# Patient Record
Sex: Male | Born: 1940 | Race: White | Hispanic: No | Marital: Married | Smoking: Never smoker
Health system: Southern US, Community
[De-identification: ages and names within clinical notes are randomized; demographics above are authoritative.]

## PROBLEM LIST (undated history)

## (undated) DIAGNOSIS — I499 Cardiac arrhythmia, unspecified: Secondary | ICD-10-CM

## (undated) DIAGNOSIS — E785 Hyperlipidemia, unspecified: Secondary | ICD-10-CM

## (undated) DIAGNOSIS — M5126 Other intervertebral disc displacement, lumbar region: Secondary | ICD-10-CM

## (undated) DIAGNOSIS — N3010 Interstitial cystitis (chronic) without hematuria: Secondary | ICD-10-CM

## (undated) DIAGNOSIS — M7550 Bursitis of unspecified shoulder: Secondary | ICD-10-CM

## (undated) DIAGNOSIS — Z8719 Personal history of other diseases of the digestive system: Secondary | ICD-10-CM

## (undated) DIAGNOSIS — Z87442 Personal history of urinary calculi: Secondary | ICD-10-CM

## (undated) DIAGNOSIS — K649 Unspecified hemorrhoids: Secondary | ICD-10-CM

## (undated) DIAGNOSIS — N419 Inflammatory disease of prostate, unspecified: Secondary | ICD-10-CM

## (undated) DIAGNOSIS — I10 Essential (primary) hypertension: Secondary | ICD-10-CM

## (undated) DIAGNOSIS — M51369 Other intervertebral disc degeneration, lumbar region without mention of lumbar back pain or lower extremity pain: Secondary | ICD-10-CM

## (undated) DIAGNOSIS — N189 Chronic kidney disease, unspecified: Secondary | ICD-10-CM

## (undated) DIAGNOSIS — N400 Enlarged prostate without lower urinary tract symptoms: Secondary | ICD-10-CM

## (undated) DIAGNOSIS — C801 Malignant (primary) neoplasm, unspecified: Secondary | ICD-10-CM

## (undated) DIAGNOSIS — R42 Dizziness and giddiness: Secondary | ICD-10-CM

## (undated) DIAGNOSIS — Z860100 Personal history of colon polyps, unspecified: Secondary | ICD-10-CM

## (undated) DIAGNOSIS — M5136 Other intervertebral disc degeneration, lumbar region: Secondary | ICD-10-CM

## (undated) DIAGNOSIS — M1990 Unspecified osteoarthritis, unspecified site: Secondary | ICD-10-CM

## (undated) DIAGNOSIS — K219 Gastro-esophageal reflux disease without esophagitis: Secondary | ICD-10-CM

## (undated) HISTORY — DX: Personal history of colon polyps, unspecified: Z86.0100

## (undated) HISTORY — PX: HEMORRHOID BANDING: SHX5850

## (undated) HISTORY — PX: OTHER SURGICAL HISTORY: SHX169

## (undated) HISTORY — PX: PROSTATE SURGERY: SHX751

## (undated) HISTORY — DX: Personal history of colonic polyps: Z86.010

## (undated) HISTORY — DX: Interstitial cystitis (chronic) without hematuria: N30.10

## (undated) HISTORY — PX: LITHOTRIPSY: SUR834

## (undated) HISTORY — PX: HERNIA REPAIR: SHX51

## (undated) HISTORY — DX: Gastro-esophageal reflux disease without esophagitis: K21.9

## (undated) HISTORY — DX: Hyperlipidemia, unspecified: E78.5

## (undated) HISTORY — DX: Benign prostatic hyperplasia without lower urinary tract symptoms: N40.0

---

## 1996-08-18 HISTORY — PX: TRANSURETHRAL RESECTION OF PROSTATE: SHX73

## 2006-07-31 ENCOUNTER — Ambulatory Visit: Payer: Self-pay | Admitting: Internal Medicine

## 2006-08-25 ENCOUNTER — Ambulatory Visit: Payer: Self-pay | Admitting: Otolaryngology

## 2007-02-17 ENCOUNTER — Ambulatory Visit: Payer: Self-pay | Admitting: Unknown Physician Specialty

## 2007-12-30 ENCOUNTER — Emergency Department: Payer: Self-pay | Admitting: Unknown Physician Specialty

## 2008-08-28 ENCOUNTER — Ambulatory Visit: Payer: Self-pay | Admitting: Unknown Physician Specialty

## 2009-01-09 ENCOUNTER — Ambulatory Visit: Payer: Self-pay | Admitting: Urology

## 2009-01-22 ENCOUNTER — Ambulatory Visit: Payer: Self-pay | Admitting: Urology

## 2011-01-02 ENCOUNTER — Ambulatory Visit: Payer: Self-pay | Admitting: Internal Medicine

## 2012-01-26 ENCOUNTER — Ambulatory Visit: Payer: Self-pay | Admitting: Urology

## 2012-02-06 ENCOUNTER — Ambulatory Visit: Payer: Self-pay | Admitting: Unknown Physician Specialty

## 2012-06-05 DIAGNOSIS — I4510 Unspecified right bundle-branch block: Secondary | ICD-10-CM

## 2012-06-05 HISTORY — DX: Unspecified right bundle-branch block: I45.10

## 2012-06-28 ENCOUNTER — Ambulatory Visit: Payer: Self-pay | Admitting: Cardiovascular Disease

## 2012-07-06 ENCOUNTER — Ambulatory Visit (INDEPENDENT_AMBULATORY_CARE_PROVIDER_SITE_OTHER): Payer: Medicare Other | Admitting: Cardiovascular Disease

## 2012-07-06 ENCOUNTER — Encounter: Payer: Self-pay | Admitting: Cardiovascular Disease

## 2012-07-06 VITALS — BP 138/86 | HR 74 | Resp 16 | Ht 72.0 in | Wt 216.8 lb

## 2012-07-06 DIAGNOSIS — R0789 Other chest pain: Secondary | ICD-10-CM

## 2012-07-06 DIAGNOSIS — E785 Hyperlipidemia, unspecified: Secondary | ICD-10-CM

## 2012-07-06 DIAGNOSIS — I4510 Unspecified right bundle-branch block: Secondary | ICD-10-CM | POA: Insufficient documentation

## 2012-07-06 DIAGNOSIS — I10 Essential (primary) hypertension: Secondary | ICD-10-CM | POA: Insufficient documentation

## 2012-07-06 NOTE — Assessment & Plan Note (Signed)
Blood pressure is well controlled on today's visit. No changes made to the medications. 

## 2012-07-06 NOTE — Assessment & Plan Note (Signed)
We have discussed his symptoms at length and possible treatment options. Symptoms are somewhat atypical, concerning for his hiatal hernia. Symptoms seem to happen when he is in bed and rolls on his side. He would like to hold off on treadmill testing, possibly until early next year. He is active with no cardiac symptoms concerning for angina.

## 2012-07-06 NOTE — Assessment & Plan Note (Signed)
Benign EKG finding. We have discussed this with him.

## 2012-07-06 NOTE — Progress Notes (Signed)
Patient ID: Reginald Cobb, male    DOB: [birth year], 71 y.o.   MRN: [id number]  HPI Comments: Mr. Mazon is a very pleasant 71 year old gentleman with history of GERD, hiatal hernia seen on EGD and CT scan, hyperlipidemia, interstitial cystitis who presents for fluttering in his chest. He is a patient of Dr. Arlana Pouch.  He reports that since he stopped his Prilosec earlier, he started getting some chest discomfort and fluttering in his chest, particularly when moving in bed from side to side. He has restarted his Prilosec and he denies any further chest discomfort. He does continue to have occasional flutter feeling in the middle of his mediastinum when he rolls on his side. 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 When active on his acreage, he denies any chest pain, shortness of breath, lightheadedness or dizziness.  Stress test May 2007 was essentially normal. No ischemia, normal ejection fraction. He is a nonsmoker, no diabetes. CT scan of the abdomen and pelvis did show scattered atherosclerotic calcification in the aorta and branches, kidney stones  EKG shows normal sinus rhythm with rate 74 beats per minute, right bundle branch block   Outpatient Encounter Prescriptions as of 07/06/2012  Medication Sig Dispense Refill  . atorvastatin (LIPITOR) 20 MG tablet Take 20 mg by mouth daily.      . Biotin (BIOTIN 5000) 5 MG CAPS Take 5 mg by mouth daily.      . hydrocortisone (ANUSOL-HC) 25 MG suppository Place 25 mg rectally 2 (two) times daily as needed.      Marland Kitchen Hyoscyamine Sulfate 0.375 MG CP12 Take 0.375 mg by mouth daily.       . Meth-Hyo-M Bl-Na Phos-Ph Sal (URIBEL PO) Take by mouth as needed.      . mometasone (NASONEX) 50 MCG/ACT nasal spray Place 2 sprays into the nose as needed.       . NON FORMULARY Omega XL take 4 tablets daily.      Marland Kitchen omeprazole (PRILOSEC) 40 MG capsule Take 40 mg by mouth daily.       . Probiotic Product (PROBIOTIC DAILY PO) Take by mouth daily.      . Red Yeast Rice Extract (RED YEAST RICE PO) Take 1,200 mg by mouth daily.       Review of Systems  Constitutional: Negative.   HENT: Negative.   Eyes: Negative.   Respiratory: Negative.   Cardiovascular: Negative.        Fluttering in his chest at night when rolling on his side  Gastrointestinal: Negative.   Musculoskeletal: Negative.   Skin: Negative.   Neurological: Negative.   Hematological: Negative.   Psychiatric/Behavioral: Negative.   All other systems reviewed and are negative.    BP 138/86  Pulse 74  Resp 16  Ht 6' (1.829 m)  Wt 216 lb 12 oz (98.317 kg)  BMI 29.40 kg/m2  Physical Exam  Nursing note and vitals reviewed. Constitutional: He is oriented to person, place, and time. He appears well-developed and well-nourished.  HENT:  Head: Normocephalic.  Nose: Nose normal.  Mouth/Throat: Oropharynx is clear and moist.  Eyes: Conjunctivae normal are normal. Pupils are equal, round, and reactive to light.  Neck: Normal range of motion. Neck supple. No JVD present.  Cardiovascular: Normal rate, regular rhythm, S1 normal, S2 normal, normal heart sounds and intact distal pulses.  Exam reveals no gallop and no friction rub.   No murmur heard. Pulmonary/Chest:  Effort normal and breath sounds normal. No respiratory distress. He has no wheezes. He has no rales. He exhibits no tenderness.  Abdominal: Soft. Bowel sounds are normal. He exhibits no distension. There is no tenderness.  Musculoskeletal: Normal range of motion. He exhibits no edema and no tenderness.  Lymphadenopathy:    He has no cervical adenopathy.  Neurological: He is alert and oriented to person, place, and time. Coordination normal.  Skin: Skin is warm and dry. No rash noted. No erythema.  Psychiatric: He has a normal mood and affect. His behavior is normal. Judgment and thought content normal.           Assessment and Plan

## 2012-07-06 NOTE — Assessment & Plan Note (Signed)
Cholesterol is at goal on the current lipid regimen. No changes to the medications were made.  

## 2012-07-06 NOTE — Patient Instructions (Addendum)
You are doing well. No medication changes were made.  Call the office if you have worsening chest discomfort, we would perform a stress test  Please call us if you have new issues that need to be addressed before your next appt.

## 2012-08-23 ENCOUNTER — Telehealth: Payer: Self-pay | Admitting: *Deleted

## 2013-06-08 ENCOUNTER — Emergency Department: Payer: Self-pay | Admitting: Emergency Medicine

## 2013-06-08 LAB — BASIC METABOLIC PANEL
Anion Gap: 3 — ABNORMAL LOW (ref 7–16)
BUN: 12 mg/dL (ref 7–18)
Co2: 30 mmol/L (ref 21–32)
EGFR (African American): >60
EGFR (Non-African Amer.): >60
Glucose: 90 mg/dL (ref 65–99)
Osmolality: 277 (ref 275–301)

## 2013-06-08 LAB — URINALYSIS, COMPLETE
Bilirubin,UR: NEGATIVE
Nitrite: NEGATIVE
Protein: NEGATIVE
Specific Gravity: 1.006 (ref 1.003–1.030)
WBC UR: 1 /HPF (ref 0–5)

## 2013-06-08 LAB — CBC
HCT: 41.2 % (ref 40.0–52.0)
HGB: 14.3 g/dL (ref 13.0–18.0)
MCHC: 34.7 g/dL (ref 32.0–36.0)
Platelet: 234 10*3/uL (ref 150–440)
RBC: 4.41 10*6/uL (ref 4.40–5.90)
WBC: 14 10*3/uL — ABNORMAL HIGH (ref 3.8–10.6)

## 2013-07-06 ENCOUNTER — Ambulatory Visit: Payer: Self-pay | Admitting: Urology

## 2013-07-06 DIAGNOSIS — E789 Disorder of lipoprotein metabolism, unspecified: Secondary | ICD-10-CM

## 2013-07-07 ENCOUNTER — Ambulatory Visit: Payer: Self-pay | Admitting: Urology

## 2015-09-19 DIAGNOSIS — J069 Acute upper respiratory infection, unspecified: Secondary | ICD-10-CM | POA: Diagnosis not present

## 2015-10-11 DIAGNOSIS — N401 Enlarged prostate with lower urinary tract symptoms: Secondary | ICD-10-CM | POA: Diagnosis not present

## 2015-10-11 DIAGNOSIS — R3916 Straining to void: Secondary | ICD-10-CM | POA: Diagnosis not present

## 2015-10-11 DIAGNOSIS — R350 Frequency of micturition: Secondary | ICD-10-CM | POA: Diagnosis not present

## 2015-10-11 DIAGNOSIS — N3010 Interstitial cystitis (chronic) without hematuria: Secondary | ICD-10-CM | POA: Diagnosis not present

## 2015-10-11 DIAGNOSIS — R351 Nocturia: Secondary | ICD-10-CM | POA: Diagnosis not present

## 2015-10-25 DIAGNOSIS — N390 Urinary tract infection, site not specified: Secondary | ICD-10-CM | POA: Diagnosis not present

## 2015-10-25 DIAGNOSIS — N3010 Interstitial cystitis (chronic) without hematuria: Secondary | ICD-10-CM | POA: Diagnosis not present

## 2015-10-25 DIAGNOSIS — R300 Dysuria: Secondary | ICD-10-CM | POA: Diagnosis not present

## 2015-11-23 ENCOUNTER — Other Ambulatory Visit: Payer: Self-pay | Admitting: Internal Medicine

## 2015-11-23 DIAGNOSIS — R1084 Generalized abdominal pain: Secondary | ICD-10-CM

## 2015-11-23 DIAGNOSIS — R1013 Epigastric pain: Secondary | ICD-10-CM

## 2015-11-23 DIAGNOSIS — K210 Gastro-esophageal reflux disease with esophagitis: Secondary | ICD-10-CM | POA: Diagnosis not present

## 2015-11-23 DIAGNOSIS — R109 Unspecified abdominal pain: Secondary | ICD-10-CM | POA: Diagnosis not present

## 2015-11-30 ENCOUNTER — Ambulatory Visit
Admission: RE | Admit: 2015-11-30 | Discharge: 2015-11-30 | Disposition: A | Discharge status: Home / Self Care | Payer: PPO | Source: Ambulatory Visit | Attending: Internal Medicine | Admitting: Internal Medicine

## 2015-11-30 DIAGNOSIS — R1013 Epigastric pain: Secondary | ICD-10-CM

## 2015-12-13 DIAGNOSIS — N3010 Interstitial cystitis (chronic) without hematuria: Secondary | ICD-10-CM | POA: Diagnosis not present

## 2015-12-20 DIAGNOSIS — N3010 Interstitial cystitis (chronic) without hematuria: Secondary | ICD-10-CM | POA: Diagnosis not present

## 2016-01-03 DIAGNOSIS — K219 Gastro-esophageal reflux disease without esophagitis: Secondary | ICD-10-CM | POA: Diagnosis not present

## 2016-01-03 DIAGNOSIS — R634 Abnormal weight loss: Secondary | ICD-10-CM | POA: Diagnosis not present

## 2016-01-03 DIAGNOSIS — R1013 Epigastric pain: Secondary | ICD-10-CM | POA: Diagnosis not present

## 2016-01-03 DIAGNOSIS — Z86010 Personal history of colonic polyps: Secondary | ICD-10-CM | POA: Diagnosis not present

## 2016-01-16 DIAGNOSIS — I10 Essential (primary) hypertension: Secondary | ICD-10-CM | POA: Diagnosis not present

## 2016-01-16 DIAGNOSIS — E785 Hyperlipidemia, unspecified: Secondary | ICD-10-CM | POA: Diagnosis not present

## 2016-01-18 DIAGNOSIS — E785 Hyperlipidemia, unspecified: Secondary | ICD-10-CM | POA: Diagnosis not present

## 2016-01-18 DIAGNOSIS — K210 Gastro-esophageal reflux disease with esophagitis: Secondary | ICD-10-CM | POA: Diagnosis not present

## 2016-02-14 ENCOUNTER — Encounter: Payer: Self-pay | Admitting: Emergency Medicine

## 2016-02-14 ENCOUNTER — Emergency Department: Payer: PPO

## 2016-02-14 ENCOUNTER — Emergency Department
Admission: EM | Admit: 2016-02-14 | Discharge: 2016-02-14 | Disposition: A | Discharge status: Home / Self Care | Payer: PPO | Attending: Emergency Medicine | Admitting: Emergency Medicine

## 2016-02-14 DIAGNOSIS — K2100 Gastro-esophageal reflux disease with esophagitis, without bleeding: Secondary | ICD-10-CM

## 2016-02-14 DIAGNOSIS — R1010 Upper abdominal pain, unspecified: Secondary | ICD-10-CM | POA: Diagnosis not present

## 2016-02-14 DIAGNOSIS — Z79899 Other long term (current) drug therapy: Secondary | ICD-10-CM | POA: Insufficient documentation

## 2016-02-14 DIAGNOSIS — E785 Hyperlipidemia, unspecified: Secondary | ICD-10-CM | POA: Diagnosis not present

## 2016-02-14 DIAGNOSIS — R079 Chest pain, unspecified: Secondary | ICD-10-CM | POA: Diagnosis not present

## 2016-02-14 DIAGNOSIS — Z7951 Long term (current) use of inhaled steroids: Secondary | ICD-10-CM | POA: Diagnosis not present

## 2016-02-14 DIAGNOSIS — K219 Gastro-esophageal reflux disease without esophagitis: Secondary | ICD-10-CM | POA: Diagnosis not present

## 2016-02-14 DIAGNOSIS — K2970 Gastritis, unspecified, without bleeding: Secondary | ICD-10-CM

## 2016-02-14 LAB — COMPREHENSIVE METABOLIC PANEL
ALK PHOS: 60 U/L (ref 38–126)
ALT: 14 U/L — AB (ref 17–63)
ANION GAP: 4 — AB (ref 5–15)
AST: 18 U/L (ref 15–41)
Albumin: 4.2 g/dL (ref 3.5–5.0)
BUN: 9 mg/dL (ref 6–20)
CALCIUM: 9 mg/dL (ref 8.9–10.3)
CO2: 29 mmol/L (ref 22–32)
CREATININE: 1.06 mg/dL (ref 0.61–1.24)
Chloride: 106 mmol/L (ref 101–111)
Glucose, Bld: 94 mg/dL (ref 65–99)
Potassium: 3.8 mmol/L (ref 3.5–5.1)
Sodium: 139 mmol/L (ref 135–145)
TOTAL PROTEIN: 6.3 g/dL — AB (ref 6.5–8.1)
Total Bilirubin: 0.6 mg/dL (ref 0.3–1.2)

## 2016-02-14 LAB — CBC WITH DIFFERENTIAL/PLATELET
BASOS ABS: 0.1 10*3/uL (ref 0–0.1)
BASOS PCT: 1 %
EOS ABS: 0.2 10*3/uL (ref 0–0.7)
EOS PCT: 2 %
HCT: 40.5 % (ref 40.0–52.0)
Hemoglobin: 14.2 g/dL (ref 13.0–18.0)
LYMPHS PCT: 46 %
Lymphs Abs: 4.4 10*3/uL — ABNORMAL HIGH (ref 1.0–3.6)
MCH: 32.5 pg (ref 26.0–34.0)
MCHC: 35 g/dL (ref 32.0–36.0)
MCV: 92.7 fL (ref 80.0–100.0)
MONO ABS: 0.8 10*3/uL (ref 0.2–1.0)
Monocytes Relative: 9 %
Neutro Abs: 3.9 10*3/uL (ref 1.4–6.5)
Neutrophils Relative %: 42 %
PLATELETS: 216 10*3/uL (ref 150–440)
RBC: 4.37 MIL/uL — ABNORMAL LOW (ref 4.40–5.90)
RDW: 13 % (ref 11.5–14.5)
WBC: 9.5 10*3/uL (ref 3.8–10.6)

## 2016-02-14 LAB — LIPASE, BLOOD: LIPASE: 17 U/L (ref 11–51)

## 2016-02-14 LAB — TROPONIN I

## 2016-02-14 MED ORDER — GI COCKTAIL ~~LOC~~
30.0000 mL | ORAL | Status: AC
  Administered 2016-02-14: 30 mL via ORAL
  Filled 2016-02-14: qty 30

## 2016-02-14 MED ORDER — METOCLOPRAMIDE HCL 10 MG PO TABS: 10.0000 mg | ORAL_TABLET | Freq: Three times a day (TID) | ORAL | Status: DC

## 2016-02-14 MED ORDER — METOCLOPRAMIDE HCL 5 MG/ML IJ SOLN
10.0000 mg | Freq: Once | INTRAMUSCULAR | Status: AC
  Administered 2016-02-14: 10 mg via INTRAVENOUS
  Filled 2016-02-14: qty 2

## 2016-02-14 MED ORDER — SODIUM CHLORIDE 0.9 % IV BOLUS (SEPSIS)
1000.0000 mL | Freq: Once | INTRAVENOUS | Status: AC
  Administered 2016-02-14: 1000 mL via INTRAVENOUS

## 2016-02-14 MED ORDER — FAMOTIDINE IN NACL 20-0.9 MG/50ML-% IV SOLN
20.0000 mg | Freq: Once | INTRAVENOUS | Status: AC
  Administered 2016-02-14: 20 mg via INTRAVENOUS
  Filled 2016-02-14: qty 50

## 2016-02-14 MED ORDER — FAMOTIDINE 20 MG PO TABS: 20.0000 mg | ORAL_TABLET | Freq: Two times a day (BID) | ORAL | Status: DC

## 2016-02-14 NOTE — ED Notes (Signed)
Pt is back in room s/p XR.

## 2016-02-14 NOTE — ED Provider Notes (Signed)
[facility] Emergency Department Provider Note  ____________________________________________  Time seen: 8:15 AM  I have reviewed the triage vital signs and the nursing notes.   HISTORY  Chief Complaint Chest Pain Upper abdominal pain   HPI Reginald Cobb is a 75 y.o. male who complains of upper abdominal pain radiating up into the chest. This is his symptoms he has had for a few years, but it's become increasingly frequent and severe over the last several months. For the last few days it's been constant. Normally it is worse with eating but now it just hurts all the time. He exercises multiple times a week and does not have any exertional symptoms. There are certain positions that make the pain worse and certain movements.  Not pleuritic. No cough. No fevers chills or sweats. Associated with nausea but no vomiting or diarrhea. Feels like aching.  Previously has been seen by cardiology and told this was due to hiatal hernia. Has been seen one month ago by gastroenterology Dr. Gustavo Lah. I'm unable to retrieve the visit note in the electronic medical record, but the patient reports that he was told it may be due to peptic ulcer disease. He scheduled for endoscopy, which he is trying to move up to next week.     Past Medical History  Diagnosis Date  . Hyperlipidemia   . GERD (gastroesophageal reflux disease)   . Interstitial cystitis   . History of colon polyps   . BPH (benign prostatic hyperplasia)      Patient Active Problem List   Diagnosis Date Noted  . Discomfort in chest 07/06/2012  . Hyperlipidemia 07/06/2012  . Hypertension 07/06/2012  . Right bundle branch block 07/06/2012     Past Surgical History  Procedure Laterality Date  . Hernia repair    . Transurethral resection of prostate       Current Outpatient Rx  Name  Route  Sig  Dispense  Refill  . acetaminophen (TYLENOL) 650 MG CR tablet   Oral   Take 325 mg by mouth 2 (two)  times daily.         Marland Kitchen atorvastatin (LIPITOR) 20 MG tablet   Oral   Take 20 mg by mouth daily.         . Biotin (BIOTIN 5000) 5 MG CAPS   Oral   Take 5 mg by mouth daily.         . hydrocortisone (ANUSOL-HC) 25 MG suppository   Rectal   Place 25 mg rectally 2 (two) times daily as needed.         Marland Kitchen Hyoscyamine Sulfate 0.375 MG CP12   Oral   Take 0.375 mg by mouth daily as needed.          . Meth-Hyo-M Bl-Na Phos-Ph Sal (URIBEL PO)   Oral   Take 1 capsule by mouth as needed.          . mometasone (NASONEX) 50 MCG/ACT nasal spray   Nasal   Place 2 sprays into the nose as needed.          . NON FORMULARY      Omega XL take 4 tablets daily.         Marland Kitchen omeprazole (PRILOSEC) 40 MG capsule   Oral   Take 40 mg by mouth daily.         . Probiotic Product (PROBIOTIC DAILY PO)   Oral   Take 2 tablets by mouth daily.          Marland Kitchen  sucralfate (CARAFATE) 1 GM/10ML suspension   Oral   Take 1 g by mouth 4 (four) times daily -  with meals and at bedtime.         . famotidine (PEPCID) 20 MG tablet   Oral   Take 1 tablet (20 mg total) by mouth 2 (two) times daily.   60 tablet   0   . metoCLOPramide (REGLAN) 10 MG tablet   Oral   Take 1 tablet (10 mg total) by mouth 4 (four) times daily -  before meals and at bedtime.   60 tablet   0      Allergies Review of patient's allergies indicates no known allergies.   Family History  Problem Relation Age of Onset  . Heart disease Mother   . Hyperlipidemia Father   . Heart failure Father   . Heart disease Brother   . Heart failure Brother     Social History Social History  Substance Use Topics  . Smoking status: Never Smoker   . Smokeless tobacco: None  . Alcohol Use: No    Review of Systems  Constitutional:   No fever or chills.  ENT:   No sore throat. No rhinorrhea. Cardiovascular:   Positive chest discomfort as above. Respiratory:   No dyspnea or cough. Gastrointestinal:   Upper abdominal pain  without vomiting or diarrhea.   Musculoskeletal:   Negative for focal pain or swelling  10-point ROS otherwise negative.  ____________________________________________   PHYSICAL EXAM:  VITAL SIGNS: ED Triage Vitals  Enc Vitals Group     BP 02/14/16 0759 130/92 mmHg     Pulse Rate 02/14/16 0759 64     Resp 02/14/16 0759 14     Temp 02/14/16 0759 98.1 F (36.7 C)     Temp Source 02/14/16 0759 Oral     SpO2 02/14/16 0759 100 %     Weight 02/14/16 0759 193 lb (87.544 kg)     Height 02/14/16 0759 6' (1.829 m)     Head Cir --      Peak Flow --      Pain Score 02/14/16 0801 7     Pain Loc --      Pain Edu? --      Excl. in [place]? --     Vital signs reviewed, nursing assessments reviewed.   Constitutional:   Alert and oriented. Well appearing and in no distress. Eyes:   No scleral icterus. No conjunctival pallor. PERRL. EOMI.  No nystagmus. ENT   Head:   Normocephalic and atraumatic.   Nose:   No congestion/rhinnorhea. No septal hematoma   Mouth/Throat:   MMM, no pharyngeal erythema. No peritonsillar mass.    Neck:   No stridor. No SubQ emphysema. No meningismus. Hematological/Lymphatic/Immunilogical:   No cervical lymphadenopathy. Cardiovascular:   RRR. Symmetric bilateral radial and DP pulses.  No murmurs.  Respiratory:   Normal respiratory effort without tachypnea nor retractions. Breath sounds are clear and equal bilaterally. No wheezes/rales/rhonchi. Gastrointestinal:   Soft With epigastric and left upper quadrant tenderness. Non distended. There is no CVA tenderness.  No rebound, rigidity, or guarding. Genitourinary:   deferred Musculoskeletal:   Nontender with normal range of motion in all extremities. No joint effusions.  No lower extremity tenderness.  No edema. Neurologic:   Normal speech and language.  CN 2-10 normal. Motor grossly intact. No gross focal neurologic deficits are appreciated.  Skin:    Skin is warm, dry and intact. No rash noted.  No  petechiae, purpura, or bullae.  ____________________________________________    LABS (pertinent positives/negatives) (all labs ordered are listed, but only abnormal results are displayed) Labs Reviewed  COMPREHENSIVE METABOLIC PANEL - Abnormal; Notable for the following:    Total Protein 6.3 (*)    ALT 14 (*)    Anion gap 4 (*)    All other components within normal limits  CBC WITH DIFFERENTIAL/PLATELET - Abnormal; Notable for the following:    RBC 4.37 (*)    Lymphs Abs 4.4 (*)    All other components within normal limits  LIPASE, BLOOD  TROPONIN I   ____________________________________________   EKG  Interpreted by me Sinus rhythm rate of 63, left axis, normal intervals. Right bundle branch block. No acute ischemic changes. Unchanged from 07/06/2013  ____________________________________________    RADIOLOGY  Chest x-ray unremarkable  ____________________________________________   PROCEDURES   ____________________________________________   INITIAL IMPRESSION / ASSESSMENT AND PLAN / ED COURSE  Pertinent labs & imaging results that were available during my care of the patient were reviewed by me and considered in my medical decision making (see chart for details).  Patient well appearing no acute distress, complains of acute exacerbation of chronic symptoms likely GI related. I agree that this sounds like a joint peptic ulcer disease versus hiatal hernia causing severe GERD.Considering the patient's symptoms, medical history, and physical examination today, I have low suspicion for ACS, PE, TAD, pneumothorax, carditis, mediastinitis, pneumonia, CHF, or sepsis. Low suspicion for AAA perforation or obstruction cholecystitis or pancreatitis. Recently had a right upper quadrant ultrasound which was negative for biliary disease.  I'll check labs today including CMP and lipase as well as a acute abdomen x-ray series. If there are any significant abnormalities we'll proceed  with CT scan of the abdomen due to the chronicity of the symptoms. However, if work up is benign I think the patient is stable for continued follow-up with GI with a more aggressive antiacid regimen for symptom control.   ----------------------------------------- 10:58 AM on 02/14/2016 -----------------------------------------  Vital signs stable. After medications, patient is feeling much better and symptoms have almost completely resolved. Labs are completely normal. We'll discharge home to follow up with GI. Continue famotidine and Reglan on top of the PPI he started taking.      ____________________________________________   FINAL CLINICAL IMPRESSION(S) / ED DIAGNOSES  Final diagnoses:  Gastritis  Gastroesophageal reflux disease with esophagitis       Portions of this note were generated with dragon dictation software. Dictation errors may occur despite best attempts at proofreading.   Carrie Mew, MD 02/14/16 1059

## 2016-02-14 NOTE — ED Notes (Signed)
Pt reports chest pain increasing over last few days. States he has recently had an ultrasound to rule out gallstones. Wife states that he has lost 10 pounds in last month. Pt reports he is not eating much, and that anything "greasy" makes it worse.

## 2016-02-14 NOTE — Discharge Instructions (Signed)
Gastroesophageal Reflux Disease, Adult Normally, food travels down the esophagus and stays in the stomach to be digested. However, when a person has gastroesophageal reflux disease (GERD), food and stomach acid move back up into the esophagus. When this happens, the esophagus becomes sore and inflamed. Over time, GERD can create small holes (ulcers) in the lining of the esophagus.  CAUSES This condition is caused by a problem with the muscle between the esophagus and the stomach (lower esophageal sphincter, or LES). Normally, the LES muscle closes after food passes through the esophagus to the stomach. When the LES is weakened or abnormal, it does not close properly, and that allows food and stomach acid to go back up into the esophagus. The LES can be weakened by certain dietary substances, medicines, and medical conditions, including:  Tobacco use.  Pregnancy.  Having a hiatal hernia.  Heavy alcohol use.  Certain foods and beverages, such as coffee, chocolate, onions, and peppermint. RISK FACTORS This condition is more likely to develop in:  People who have an increased body weight.  People who have connective tissue disorders.  People who use NSAID medicines. SYMPTOMS Symptoms of this condition include:  Heartburn.  Difficult or painful swallowing.  The feeling of having a lump in the throat.  Abitter taste in the mouth.  Bad breath.  Having a large amount of saliva.  Having an upset or bloated stomach.  Belching.  Chest pain.  Shortness of breath or wheezing.  Ongoing (chronic) cough or a night-time cough.  Wearing away of tooth enamel.  Weight loss. Different conditions can cause chest pain. Make sure to see your health care provider if you experience chest pain. DIAGNOSIS Your health care provider will take a medical history and perform a physical exam. To determine if you have mild or severe GERD, your health care provider may also monitor how you respond  to treatment. You may also have other tests, including:  An endoscopy toexamine your stomach and esophagus with a small camera.  A test thatmeasures the acidity level in your esophagus.  A test thatmeasures how much pressure is on your esophagus.  A barium swallow or modified barium swallow to show the shape, size, and functioning of your esophagus. TREATMENT The goal of treatment is to help relieve your symptoms and to prevent complications. Treatment for this condition may vary depending on how severe your symptoms are. Your health care provider may recommend:  Changes to your diet.  Medicine.  Surgery. HOME CARE INSTRUCTIONS Diet  Follow a diet as recommended by your health care provider. This may involve avoiding foods and drinks such as:  Coffee and tea (with or without caffeine).  Drinks that containalcohol.  Energy drinks and sports drinks.  Carbonated drinks or sodas.  Chocolate and cocoa.  Peppermint and mint flavorings.  Garlic and onions.  Horseradish.  Spicy and acidic foods, including peppers, chili powder, curry powder, vinegar, hot sauces, and barbecue sauce.  Citrus fruit juices and citrus fruits, such as oranges, lemons, and limes.  Tomato-based foods, such as red sauce, chili, salsa, and pizza with red sauce.  Fried and fatty foods, such as donuts, french fries, potato chips, and high-fat dressings.  High-fat meats, such as hot dogs and fatty cuts of red and white meats, such as rib eye steak, sausage, ham, and bacon.  High-fat dairy items, such as whole milk, butter, and cream cheese.  Eat small, frequent meals instead of large meals.  Avoid drinking large amounts of liquid with your  meals.  Avoid eating meals during the 2-3 hours before bedtime.  Avoid lying down right after you eat.  Do not exercise right after you eat. General Instructions  Pay attention to any changes in your symptoms.  Take over-the-counter and prescription  medicines only as told by your health care provider. Do not take aspirin, ibuprofen, or other NSAIDs unless your health care provider told you to do so.  Do not use any tobacco products, including cigarettes, chewing tobacco, and e-cigarettes. If you need help quitting, ask your health care provider.  Wear loose-fitting clothing. Do not wear anything tight around your waist that causes pressure on your abdomen.  Raise (elevate) the head of your bed 6 inches (15cm).  Try to reduce your stress, such as with yoga or meditation. If you need help reducing stress, ask your health care provider.  If you are overweight, reduce your weight to an amount that is healthy for you. Ask your health care provider for guidance about a safe weight loss goal.  Keep all follow-up visits as told by your health care provider. This is important. SEEK MEDICAL CARE IF:  You have new symptoms.  You have unexplained weight loss.  You have difficulty swallowing, or it hurts to swallow.  You have wheezing or a persistent cough.  Your symptoms do not improve with treatment.  You have a hoarse voice. SEEK IMMEDIATE MEDICAL CARE IF:  You have pain in your arms, neck, jaw, teeth, or back.  You feel sweaty, dizzy, or light-headed.  You have chest pain or shortness of breath.  You vomit and your vomit looks like blood or coffee grounds.  You faint.  Your stool is bloody or black.  You cannot swallow, drink, or eat.   This information is not intended to replace advice given to you by your health care provider. Make sure you discuss any questions you have with your health care provider.   Document Released: 05/14/2005 Document Revised: 04/25/2015 Document Reviewed: 11/29/2014 Elsevier Interactive Patient Education 2016 [employer]  Gastritis, Adult Gastritis is soreness and swelling (inflammation) of the lining of the stomach. Gastritis can develop as a sudden onset (acute) or long-term (chronic)  condition. If gastritis is not treated, it can lead to stomach bleeding and ulcers. CAUSES  Gastritis occurs when the stomach lining is weak or damaged. Digestive juices from the stomach then inflame the weakened stomach lining. The stomach lining may be weak or damaged due to viral or bacterial infections. One common bacterial infection is the Helicobacter pylori infection. Gastritis can also result from excessive alcohol consumption, taking certain medicines, or having too much acid in the stomach.  SYMPTOMS  In some cases, there are no symptoms. When symptoms are present, they may include:  Pain or a burning sensation in the upper abdomen.  Nausea.  Vomiting.  An uncomfortable feeling of fullness after eating. DIAGNOSIS  Your caregiver may suspect you have gastritis based on your symptoms and a physical exam. To determine the cause of your gastritis, your caregiver may perform the following:  Blood or stool tests to check for the H pylori bacterium.  Gastroscopy. A thin, flexible tube (endoscope) is passed down the esophagus and into the stomach. The endoscope has a light and camera on the end. Your caregiver uses the endoscope to view the inside of the stomach.  Taking a tissue sample (biopsy) from the stomach to examine under a microscope. TREATMENT  Depending on the cause of your gastritis, medicines may be prescribed.  If you have a bacterial infection, such as an H pylori infection, antibiotics may be given. If your gastritis is caused by too much acid in the stomach, H2 blockers or antacids may be given. Your caregiver may recommend that you stop taking aspirin, ibuprofen, or other nonsteroidal anti-inflammatory drugs (NSAIDs). HOME CARE INSTRUCTIONS  Only take over-the-counter or prescription medicines as directed by your caregiver.  If you were given antibiotic medicines, take them as directed. Finish them even if you start to feel better.  Drink enough fluids to keep your  urine clear or pale yellow.  Avoid foods and drinks that make your symptoms worse, such as:  Caffeine or alcoholic drinks.  Chocolate.  Peppermint or mint flavorings.  Garlic and onions.  Spicy foods.  Citrus fruits, such as oranges, lemons, or limes.  Tomato-based foods such as sauce, chili, salsa, and pizza.  Fried and fatty foods.  Eat small, frequent meals instead of large meals. SEEK IMMEDIATE MEDICAL CARE IF:   You have black or dark red stools.  You vomit blood or material that looks like coffee grounds.  You are unable to keep fluids down.  Your abdominal pain gets worse.  You have a fever.  You do not feel better after 1 week.  You have any other questions or concerns. MAKE SURE YOU:  Understand these instructions.  Will watch your condition.  Will get help right away if you are not doing well or get worse.   This information is not intended to replace advice given to you by your health care provider. Make sure you discuss any questions you have with your health care provider.   Document Released: 07/29/2001 Document Revised: 02/03/2012 Document Reviewed: 09/17/2011 Elsevier Interactive Patient Education [employer].

## 2016-02-14 NOTE — ED Notes (Signed)
Blood tubes sent to lab

## 2016-02-21 ENCOUNTER — Encounter: Payer: Self-pay | Admitting: *Deleted

## 2016-02-22 ENCOUNTER — Ambulatory Visit: Payer: PPO | Admitting: Anesthesiology

## 2016-02-22 ENCOUNTER — Encounter
Admission: RE | Disposition: A | Discharge status: Home / Self Care | Payer: Self-pay | Source: Ambulatory Visit | Attending: Gastroenterology

## 2016-02-22 ENCOUNTER — Ambulatory Visit
Admission: RE | Admit: 2016-02-22 | Discharge: 2016-02-22 | Disposition: A | Discharge status: Home / Self Care | Payer: PPO | Source: Ambulatory Visit | Attending: Gastroenterology | Admitting: Gastroenterology

## 2016-02-22 ENCOUNTER — Encounter: Payer: Self-pay | Admitting: Anesthesiology

## 2016-02-22 DIAGNOSIS — N400 Enlarged prostate without lower urinary tract symptoms: Secondary | ICD-10-CM | POA: Insufficient documentation

## 2016-02-22 DIAGNOSIS — N419 Inflammatory disease of prostate, unspecified: Secondary | ICD-10-CM | POA: Diagnosis not present

## 2016-02-22 DIAGNOSIS — Z8249 Family history of ischemic heart disease and other diseases of the circulatory system: Secondary | ICD-10-CM | POA: Insufficient documentation

## 2016-02-22 DIAGNOSIS — Z86010 Personal history of colonic polyps: Secondary | ICD-10-CM | POA: Insufficient documentation

## 2016-02-22 DIAGNOSIS — E785 Hyperlipidemia, unspecified: Secondary | ICD-10-CM | POA: Insufficient documentation

## 2016-02-22 DIAGNOSIS — I4510 Unspecified right bundle-branch block: Secondary | ICD-10-CM | POA: Diagnosis not present

## 2016-02-22 DIAGNOSIS — K219 Gastro-esophageal reflux disease without esophagitis: Secondary | ICD-10-CM | POA: Diagnosis not present

## 2016-02-22 DIAGNOSIS — K2970 Gastritis, unspecified, without bleeding: Secondary | ICD-10-CM | POA: Diagnosis not present

## 2016-02-22 DIAGNOSIS — R109 Unspecified abdominal pain: Secondary | ICD-10-CM | POA: Diagnosis not present

## 2016-02-22 DIAGNOSIS — K2940 Chronic atrophic gastritis without bleeding: Secondary | ICD-10-CM | POA: Diagnosis not present

## 2016-02-22 DIAGNOSIS — R1013 Epigastric pain: Secondary | ICD-10-CM | POA: Diagnosis not present

## 2016-02-22 DIAGNOSIS — K3189 Other diseases of stomach and duodenum: Secondary | ICD-10-CM | POA: Diagnosis not present

## 2016-02-22 DIAGNOSIS — K2950 Unspecified chronic gastritis without bleeding: Secondary | ICD-10-CM | POA: Diagnosis not present

## 2016-02-22 DIAGNOSIS — K449 Diaphragmatic hernia without obstruction or gangrene: Secondary | ICD-10-CM | POA: Diagnosis not present

## 2016-02-22 DIAGNOSIS — N3010 Interstitial cystitis (chronic) without hematuria: Secondary | ICD-10-CM | POA: Insufficient documentation

## 2016-02-22 DIAGNOSIS — R634 Abnormal weight loss: Secondary | ICD-10-CM | POA: Diagnosis not present

## 2016-02-22 DIAGNOSIS — Z79899 Other long term (current) drug therapy: Secondary | ICD-10-CM | POA: Insufficient documentation

## 2016-02-22 HISTORY — DX: Inflammatory disease of prostate, unspecified: N41.9

## 2016-02-22 HISTORY — PX: ESOPHAGOGASTRODUODENOSCOPY (EGD) WITH PROPOFOL: SHX5813

## 2016-02-22 HISTORY — DX: Unspecified hemorrhoids: K64.9

## 2016-02-22 SURGERY — ESOPHAGOGASTRODUODENOSCOPY (EGD) WITH PROPOFOL
Anesthesia: General

## 2016-02-22 MED ORDER — SODIUM CHLORIDE 0.9 % IV SOLN: INTRAVENOUS | Status: DC

## 2016-02-22 MED ORDER — MIDAZOLAM HCL 2 MG/2ML IJ SOLN
INTRAMUSCULAR | Status: DC | PRN
  Administered 2016-02-22: 1 mg via INTRAVENOUS

## 2016-02-22 MED ORDER — LIDOCAINE HCL (CARDIAC) 20 MG/ML IV SOLN
INTRAVENOUS | Status: DC | PRN
  Administered 2016-02-22: 30 mg via INTRAVENOUS

## 2016-02-22 MED ORDER — PROPOFOL 10 MG/ML IV BOLUS
INTRAVENOUS | Status: DC | PRN
  Administered 2016-02-22: 10 mg via INTRAVENOUS
  Administered 2016-02-22: 40 mg via INTRAVENOUS
  Administered 2016-02-22: 20 mg via INTRAVENOUS

## 2016-02-22 MED ORDER — FENTANYL CITRATE (PF) 100 MCG/2ML IJ SOLN
INTRAMUSCULAR | Status: DC | PRN
  Administered 2016-02-22: 50 ug via INTRAVENOUS

## 2016-02-22 MED ORDER — PROPOFOL 500 MG/50ML IV EMUL
INTRAVENOUS | Status: DC | PRN
  Administered 2016-02-22: 120 ug/kg/min via INTRAVENOUS

## 2016-02-22 MED ORDER — SODIUM CHLORIDE 0.9 % IV SOLN
INTRAVENOUS | Status: DC
  Administered 2016-02-22: 10:00:00 via INTRAVENOUS

## 2016-02-22 MED ORDER — GLYCOPYRROLATE 0.2 MG/ML IJ SOLN
INTRAMUSCULAR | Status: DC | PRN
  Administered 2016-02-22: 0.2 mg via INTRAVENOUS

## 2016-02-22 NOTE — H&P (Signed)
Primary Care Physician:  Albina Billet, MD Primary Gastroenterologist:  Dr. Vira Agar  Pre-Procedure History & Physical: HPI:  Reginald Cobb is a 75 y.o. male is here for an endoscopy.   Past Medical History  Diagnosis Date  . Hyperlipidemia   . GERD (gastroesophageal reflux disease)   . Interstitial cystitis   . History of colon polyps   . BPH (benign prostatic hyperplasia)   . Hemorrhoids   . Prostatitis     Past Surgical History  Procedure Laterality Date  . Hernia repair    . Transurethral resection of prostate    . Hemorrhoid banding      Prior to Admission medications   Medication Sig Start Date End Date Taking? Authorizing Provider  acetaminophen (TYLENOL) 650 MG CR tablet Take 325 mg by mouth 2 (two) times daily.   Yes Historical Provider, MD  atorvastatin (LIPITOR) 20 MG tablet Take 20 mg by mouth daily.   Yes Historical Provider, MD  Biotin (BIOTIN 5000) 5 MG CAPS Take 5 mg by mouth daily.   Yes Historical Provider, MD  esomeprazole (NEXIUM) 20 MG capsule Take 25 mg by mouth daily.   Yes Historical Provider, MD  famotidine (PEPCID) 20 MG tablet Take 1 tablet (20 mg total) by mouth 2 (two) times daily. 02/14/16  Yes Carrie Mew, MD  hydrocortisone (ANUSOL-HC) 25 MG suppository Place 25 mg rectally 2 (two) times daily as needed.   Yes Historical Provider, MD  Hyoscyamine Sulfate 0.375 MG CP12 Take 0.375 mg by mouth daily as needed.    Yes Historical Provider, MD  Meth-Hyo-M Bl-Na Phos-Ph Sal (URIBEL PO) Take 1 capsule by mouth as needed.    Yes Historical Provider, MD  metoCLOPramide (REGLAN) 10 MG tablet Take 1 tablet (10 mg total) by mouth 4 (four) times daily -  before meals and at bedtime. 02/14/16  Yes Carrie Mew, MD  mometasone (NASONEX) 50 MCG/ACT nasal spray Place 2 sprays into the nose as needed.    Yes Historical Provider, MD  NON FORMULARY Omega XL take 4 tablets daily.   Yes Historical Provider, MD  omega-3 acid ethyl esters (LOVAZA) 1 g  capsule Take by mouth 2 (two) times daily.   Yes Historical Provider, MD  omeprazole (PRILOSEC) 40 MG capsule Take 40 mg by mouth daily. Reported on 02/22/2016   Yes Historical Provider, MD  oxyCODONE-acetaminophen (PERCOCET/ROXICET) 5-325 MG tablet Take 1 tablet by mouth 2 (two) times daily.   Yes Historical Provider, MD  Probiotic Product (PROBIOTIC DAILY PO) Take 2 tablets by mouth daily.    Yes Historical Provider, MD  ranitidine (ZANTAC) 75 MG tablet Take 150 mg by mouth daily as needed for heartburn.   Yes Historical Provider, MD  sucralfate (CARAFATE) 1 GM/10ML suspension Take 1 g by mouth 4 (four) times daily -  with meals and at bedtime.   Yes Historical Provider, MD    Allergies as of 02/12/2016  . (No Known Allergies)    Family History  Problem Relation Age of Onset  . Heart disease Mother   . Hyperlipidemia Father   . Heart failure Father   . Heart disease Brother   . Heart failure Brother     Social History   Social History  . Marital Status: Married    Spouse Name: N/A  . Number of Children: N/A  . Years of Education: N/A   Occupational History  . Not on file.   Social History Main Topics  . Smoking status: Never Smoker   .  Smokeless tobacco: Not on file  . Alcohol Use: No  . Drug Use: No  . Sexual Activity: Not on file   Other Topics Concern  . Not on file   Social History Narrative    Review of Systems: See HPI, otherwise negative ROS  Physical Exam: BP 113/73 mmHg  Pulse 68  Temp(Src) 97.4 F (36.3 C) (Oral)  Resp 16  Ht 6' (1.829 m)  Wt 84.823 kg (187 lb)  BMI 25.36 kg/m2  SpO2 100% General:   Alert,  pleasant and cooperative in NAD Head:  Normocephalic and atraumatic. Neck:  Supple; no masses or thyromegaly. Lungs:  Clear throughout to auscultation.    Heart:  Regular rate and rhythm. Abdomen:  Soft, nontender and nondistended. Normal bowel sounds, without guarding, and without rebound.   Neurologic:  Alert and  oriented x4;  grossly  normal neurologically.  Impression/Plan: Reginald Cobb is here for an endoscopy to be performed for Epigastric abdominal pain, GERD  Risks, benefits, limitations, and alternatives regarding  endoscopy have been reviewed with the patient.  Questions have been answered.  All parties agreeable.   Gaylyn Cheers, MD  02/22/2016, 9:52 AM

## 2016-02-22 NOTE — Anesthesia Preprocedure Evaluation (Addendum)
Anesthesia Evaluation  Patient identified by MRN, date of birth, ID band Patient awake    Reviewed: Allergy & Precautions, NPO status , Patient's Chart, lab work & pertinent test results, reviewed documented beta blocker date and time   Airway Mallampati: II  TM Distance: >3 FB     Dental  (+) Chipped   Pulmonary           Cardiovascular hypertension, + dysrhythmias      Neuro/Psych    GI/Hepatic GERD  ,  Endo/Other    Renal/GU      Musculoskeletal   Abdominal   Peds  Hematology   Anesthesia Other Findings Rbbb. LAHB. No cardiac symptoms.  Reproductive/Obstetrics                            Anesthesia Physical Anesthesia Plan  ASA: II  Anesthesia Plan: General   Post-op Pain Management:    Induction: Intravenous  Airway Management Planned: Nasal Cannula  Additional Equipment:   Intra-op Plan:   Post-operative Plan:   Informed Consent: I have reviewed the patients History and Physical, chart, labs and discussed the procedure including the risks, benefits and alternatives for the proposed anesthesia with the patient or authorized representative who has indicated his/her understanding and acceptance.     Plan Discussed with: CRNA  Anesthesia Plan Comments:         Anesthesia Quick Evaluation

## 2016-02-22 NOTE — Anesthesia Postprocedure Evaluation (Signed)
Anesthesia Post Note  Patient: Reginald Cobb  Procedure(s) Performed: Procedure(s) (LRB): ESOPHAGOGASTRODUODENOSCOPY (EGD) WITH PROPOFOL (N/A)  Patient location during evaluation: Endoscopy Anesthesia Type: General Level of consciousness: awake and alert Pain management: pain level controlled Vital Signs Assessment: post-procedure vital signs reviewed and stable Respiratory status: spontaneous breathing, nonlabored ventilation, respiratory function stable and patient connected to nasal cannula oxygen Cardiovascular status: blood pressure returned to baseline and stable Postop Assessment: no signs of nausea or vomiting Anesthetic complications: no    Last Vitals:  Filed Vitals:   02/22/16 1045 02/22/16 1055  BP: 147/74 129/84  Pulse: 57 56  Temp:    Resp: 14 12    Last Pain: There were no vitals filed for this visit.               Brevon Dewald S

## 2016-02-22 NOTE — Op Note (Signed)
[facility] Gastroenterology Patient Name: Reginald Cobb Procedure Date: 02/22/2016 9:55 AM MRN: [id number] Account #: [id number] Date of Birth: October 28, 1940 Admit Type: Outpatient Age: 75 Room: [facility] ENDO ROOM 4 Gender: Male Note Status: Finalized Procedure:            Upper GI endoscopy Indications:          Epigastric abdominal pain Providers:            Manya Silvas, MD Referring MD:         Leona Carry. Hall Busing, MD (Referring MD) Medicines:            Propofol per Anesthesia Complications:        No immediate complications. Procedure:            Pre-Anesthesia Assessment:                       - After reviewing the risks and benefits, the patient                        was deemed in satisfactory condition to undergo the                        procedure.                       After obtaining informed consent, the endoscope was                        passed under direct vision. Throughout the procedure,                        the patient's blood pressure, pulse, and oxygen                        saturations were monitored continuously. The Endoscope                        was introduced through the mouth, and advanced to the                        second part of duodenum. The upper GI endoscopy was                        accomplished without difficulty. The patient tolerated                        the procedure well. Findings:      The examined esophagus was normal. GEJ 40cm.      Diffuse mild- moderate inflammation characterized by erythema and       granularity was found in the gastric body and in the gastric antrum.       Biopsies were taken with a cold forceps for histology. Biopsies were       taken with a cold forceps for Helicobacter pylori testing.      The examined duodenum was normal.      A small hiatal hernia was present. Impression:           - Normal esophagus.                       - Gastritis. Biopsied.                       -  Normal examined  duodenum. Recommendation:       - Await pathology results.                       - The findings and recommendations were discussed with                        the patient. Manya Silvas, MD 02/22/2016 10:11:53 AM This report has been signed electronically. Number of Addenda: 0 Note Initiated On: 02/22/2016 9:55 AM      [facility]

## 2016-02-22 NOTE — Transfer of Care (Signed)
Immediate Anesthesia Transfer of Care Note  Patient: Reginald Cobb  Procedure(s) Performed: Procedure(s): ESOPHAGOGASTRODUODENOSCOPY (EGD) WITH PROPOFOL (N/A)  Patient Location: PACU  Anesthesia Type:General  Level of Consciousness: sedated  Airway & Oxygen Therapy: Patient Spontanous Breathing and Patient connected to nasal cannula oxygen  Post-op Assessment: Report given to RN and Post -op Vital signs reviewed and stable  Post vital signs: Reviewed and stable  Last Vitals:  Filed Vitals:   02/22/16 0922  BP: 113/73  Pulse: 68  Temp: 36.3 C  Resp: 16    Last Pain: There were no vitals filed for this visit.       Complications: No apparent anesthesia complications

## 2016-02-22 NOTE — Anesthesia Procedure Notes (Signed)
Date/Time: 02/22/2016 9:58 AM Performed by: Johnna Acosta Pre-anesthesia Checklist: Patient identified, Emergency Drugs available, Suction available, Patient being monitored and Timeout performed Oxygen Delivery Method: Nasal cannula

## 2016-02-24 ENCOUNTER — Encounter: Payer: Self-pay | Admitting: Unknown Physician Specialty

## 2016-02-25 LAB — SURGICAL PATHOLOGY

## 2016-04-30 DIAGNOSIS — C4441 Basal cell carcinoma of skin of scalp and neck: Secondary | ICD-10-CM | POA: Diagnosis not present

## 2016-04-30 DIAGNOSIS — L218 Other seborrheic dermatitis: Secondary | ICD-10-CM | POA: Diagnosis not present

## 2016-04-30 DIAGNOSIS — L718 Other rosacea: Secondary | ICD-10-CM | POA: Diagnosis not present

## 2016-04-30 DIAGNOSIS — D485 Neoplasm of uncertain behavior of skin: Secondary | ICD-10-CM | POA: Diagnosis not present

## 2016-04-30 DIAGNOSIS — L570 Actinic keratosis: Secondary | ICD-10-CM | POA: Diagnosis not present

## 2016-04-30 DIAGNOSIS — D225 Melanocytic nevi of trunk: Secondary | ICD-10-CM | POA: Diagnosis not present

## 2016-04-30 DIAGNOSIS — D2261 Melanocytic nevi of right upper limb, including shoulder: Secondary | ICD-10-CM | POA: Diagnosis not present

## 2016-04-30 DIAGNOSIS — X32XXXA Exposure to sunlight, initial encounter: Secondary | ICD-10-CM | POA: Diagnosis not present

## 2016-05-07 DIAGNOSIS — H2513 Age-related nuclear cataract, bilateral: Secondary | ICD-10-CM | POA: Diagnosis not present

## 2016-05-21 DIAGNOSIS — C4441 Basal cell carcinoma of skin of scalp and neck: Secondary | ICD-10-CM | POA: Diagnosis not present

## 2016-05-21 DIAGNOSIS — L905 Scar conditions and fibrosis of skin: Secondary | ICD-10-CM | POA: Diagnosis not present

## 2016-06-12 DIAGNOSIS — N3020 Other chronic cystitis without hematuria: Secondary | ICD-10-CM | POA: Diagnosis not present

## 2016-06-25 DIAGNOSIS — E785 Hyperlipidemia, unspecified: Secondary | ICD-10-CM | POA: Diagnosis not present

## 2016-06-26 DIAGNOSIS — J209 Acute bronchitis, unspecified: Secondary | ICD-10-CM | POA: Diagnosis not present

## 2016-06-26 DIAGNOSIS — E785 Hyperlipidemia, unspecified: Secondary | ICD-10-CM | POA: Diagnosis not present

## 2016-06-26 DIAGNOSIS — K210 Gastro-esophageal reflux disease with esophagitis: Secondary | ICD-10-CM | POA: Diagnosis not present

## 2016-07-01 ENCOUNTER — Emergency Department
Admission: EM | Admit: 2016-07-01 | Discharge: 2016-07-01 | Disposition: A | Discharge status: Home / Self Care | Payer: PPO | Attending: Emergency Medicine | Admitting: Emergency Medicine

## 2016-07-01 ENCOUNTER — Emergency Department: Payer: PPO

## 2016-07-01 ENCOUNTER — Encounter: Payer: Self-pay | Admitting: Emergency Medicine

## 2016-07-01 DIAGNOSIS — Z79899 Other long term (current) drug therapy: Secondary | ICD-10-CM | POA: Insufficient documentation

## 2016-07-01 DIAGNOSIS — Z791 Long term (current) use of non-steroidal anti-inflammatories (NSAID): Secondary | ICD-10-CM | POA: Insufficient documentation

## 2016-07-01 DIAGNOSIS — M545 Low back pain: Secondary | ICD-10-CM | POA: Insufficient documentation

## 2016-07-01 DIAGNOSIS — S3993XA Unspecified injury of pelvis, initial encounter: Secondary | ICD-10-CM | POA: Diagnosis not present

## 2016-07-01 DIAGNOSIS — I10 Essential (primary) hypertension: Secondary | ICD-10-CM | POA: Diagnosis not present

## 2016-07-01 DIAGNOSIS — Y999 Unspecified external cause status: Secondary | ICD-10-CM | POA: Diagnosis not present

## 2016-07-01 DIAGNOSIS — Y9389 Activity, other specified: Secondary | ICD-10-CM | POA: Diagnosis not present

## 2016-07-01 DIAGNOSIS — S3992XA Unspecified injury of lower back, initial encounter: Secondary | ICD-10-CM | POA: Diagnosis not present

## 2016-07-01 DIAGNOSIS — R202 Paresthesia of skin: Secondary | ICD-10-CM | POA: Diagnosis not present

## 2016-07-01 DIAGNOSIS — Y92410 Unspecified street and highway as the place of occurrence of the external cause: Secondary | ICD-10-CM | POA: Diagnosis not present

## 2016-07-01 MED ORDER — TRAMADOL HCL 50 MG PO TABS: 50.0000 mg | ORAL_TABLET | Freq: Four times a day (QID) | ORAL | 0 refills | Status: AC | PRN

## 2016-07-01 MED ORDER — TRAMADOL HCL 50 MG PO TABS
50.0000 mg | ORAL_TABLET | Freq: Once | ORAL | Status: AC
Start: 2016-07-01 — End: 2016-07-01
  Administered 2016-07-01: 50 mg via ORAL

## 2016-07-01 MED ORDER — TRAMADOL HCL 50 MG PO TABS
100.0000 mg | ORAL_TABLET | Freq: Once | ORAL | Status: DC
  Filled 2016-07-01: qty 1
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
## 2016-07-07 DIAGNOSIS — N3011 Interstitial cystitis (chronic) with hematuria: Secondary | ICD-10-CM | POA: Diagnosis not present

## 2016-07-18 DIAGNOSIS — M5430 Sciatica, unspecified side: Secondary | ICD-10-CM | POA: Diagnosis not present

## 2016-07-18 DIAGNOSIS — M545 Low back pain: Secondary | ICD-10-CM | POA: Diagnosis not present

## 2016-09-01 DIAGNOSIS — N3090 Cystitis, unspecified without hematuria: Secondary | ICD-10-CM | POA: Diagnosis not present

## 2016-09-01 DIAGNOSIS — R351 Nocturia: Secondary | ICD-10-CM | POA: Diagnosis not present

## 2016-09-22 DIAGNOSIS — L570 Actinic keratosis: Secondary | ICD-10-CM | POA: Diagnosis not present

## 2016-09-22 DIAGNOSIS — D2262 Melanocytic nevi of left upper limb, including shoulder: Secondary | ICD-10-CM | POA: Diagnosis not present

## 2016-09-22 DIAGNOSIS — D485 Neoplasm of uncertain behavior of skin: Secondary | ICD-10-CM | POA: Diagnosis not present

## 2016-09-22 DIAGNOSIS — D225 Melanocytic nevi of trunk: Secondary | ICD-10-CM | POA: Diagnosis not present

## 2016-09-22 DIAGNOSIS — D2261 Melanocytic nevi of right upper limb, including shoulder: Secondary | ICD-10-CM | POA: Diagnosis not present

## 2016-09-22 DIAGNOSIS — C4441 Basal cell carcinoma of skin of scalp and neck: Secondary | ICD-10-CM | POA: Diagnosis not present

## 2016-09-22 DIAGNOSIS — Z85828 Personal history of other malignant neoplasm of skin: Secondary | ICD-10-CM | POA: Diagnosis not present

## 2016-09-22 DIAGNOSIS — X32XXXA Exposure to sunlight, initial encounter: Secondary | ICD-10-CM | POA: Diagnosis not present

## 2016-09-22 DIAGNOSIS — C44319 Basal cell carcinoma of skin of other parts of face: Secondary | ICD-10-CM | POA: Diagnosis not present

## 2016-10-06 DIAGNOSIS — N3090 Cystitis, unspecified without hematuria: Secondary | ICD-10-CM | POA: Diagnosis not present

## 2016-10-31 DIAGNOSIS — M545 Low back pain: Secondary | ICD-10-CM | POA: Diagnosis not present

## 2016-11-04 DIAGNOSIS — C44319 Basal cell carcinoma of skin of other parts of face: Secondary | ICD-10-CM | POA: Diagnosis not present

## 2016-11-04 DIAGNOSIS — L905 Scar conditions and fibrosis of skin: Secondary | ICD-10-CM | POA: Diagnosis not present

## 2016-11-11 DIAGNOSIS — D225 Melanocytic nevi of trunk: Secondary | ICD-10-CM | POA: Diagnosis not present

## 2016-11-11 DIAGNOSIS — L905 Scar conditions and fibrosis of skin: Secondary | ICD-10-CM | POA: Diagnosis not present

## 2016-11-15 IMAGING — US US ABDOMEN COMPLETE
1 series · 13 of 25 positions shown · non-contrast
Comparison: KUB July 07, 2013 and abdominal and pelvic CT
scan January 26, 2012.

CLINICAL DATA: Epigastric abdominal pain ; history of kidney
stones.

EXAM:
ABDOMEN ULTRASOUND COMPLETE

[Series 1: us abdomen complete · 0.22mm/px · 13 of 83 slices shown]
[im 1/83]
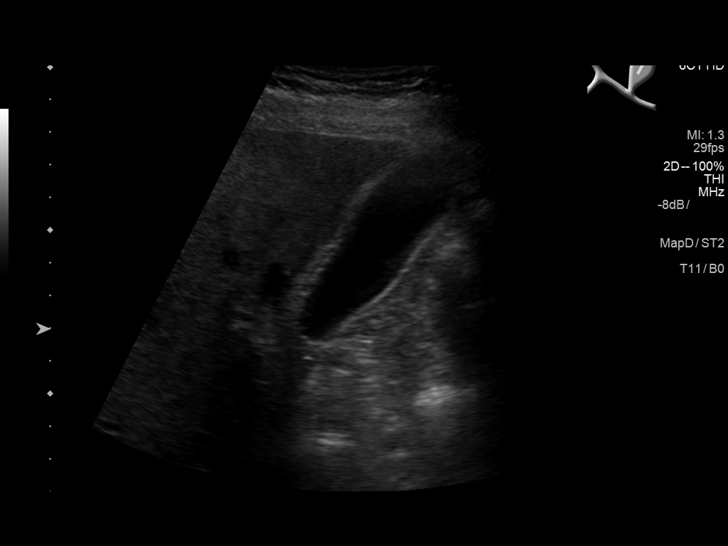
[im 7/83]
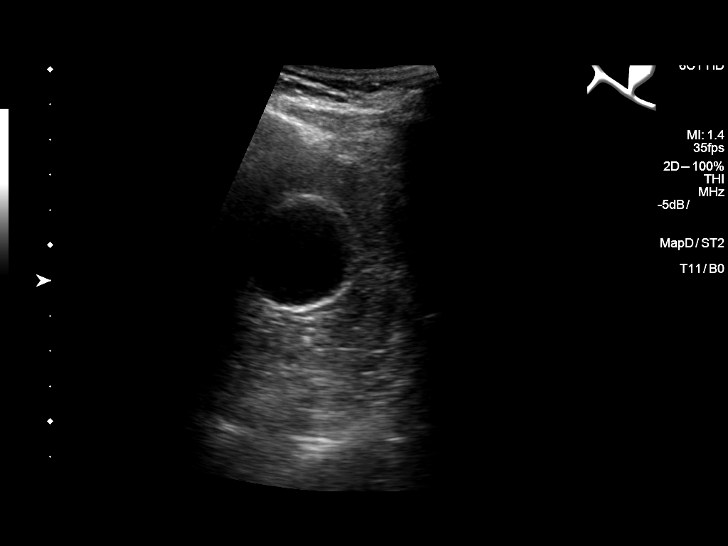
[im 14/83]
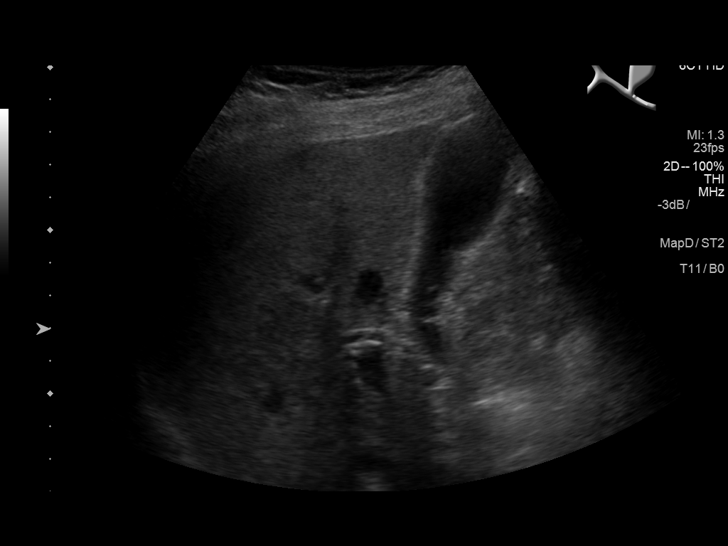
[im 21/83]
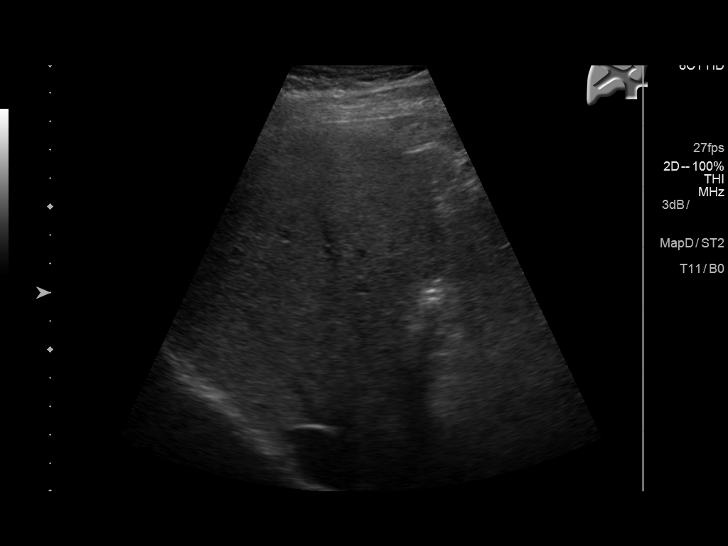
[im 28/83]
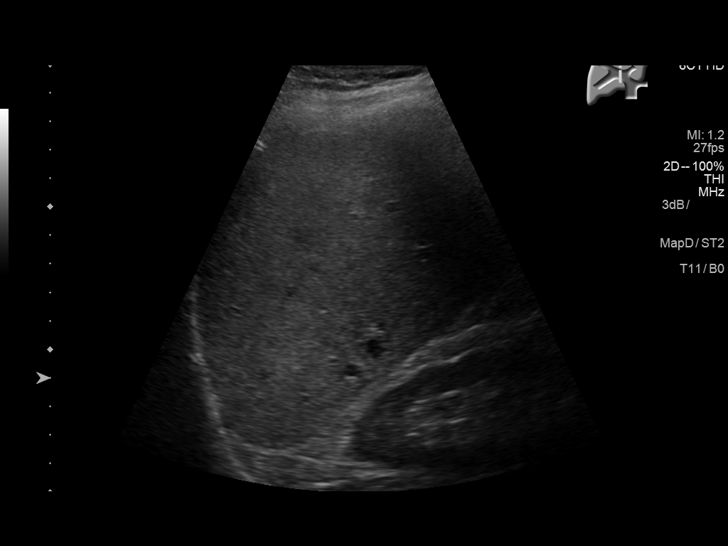
[im 35/83]
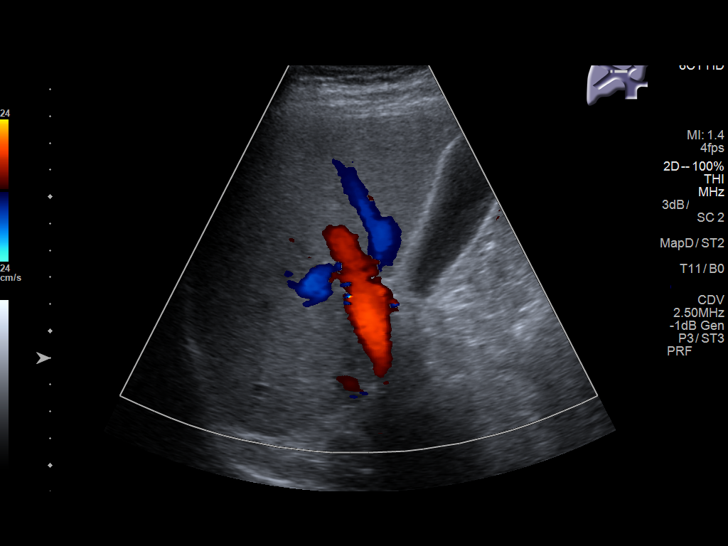
[im 42/83]
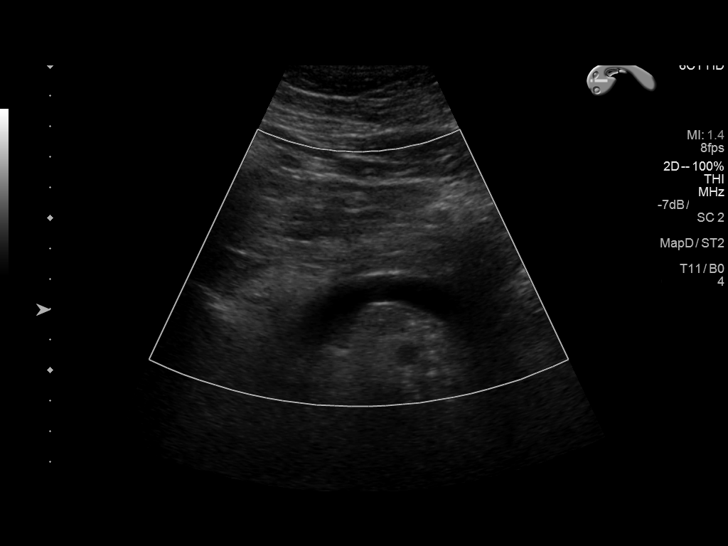
[im 48/83]
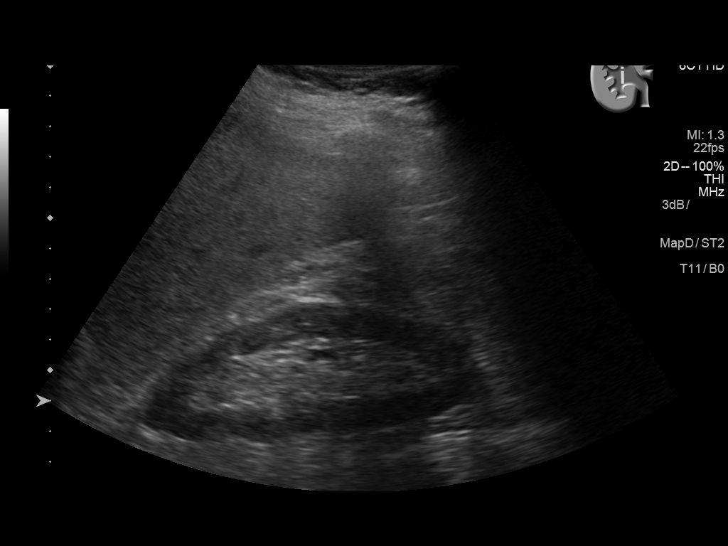
[im 55/83]
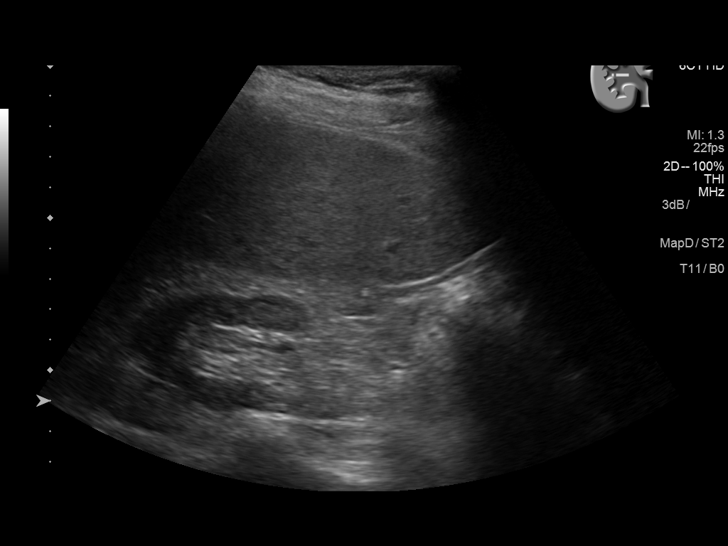
[im 62/83]
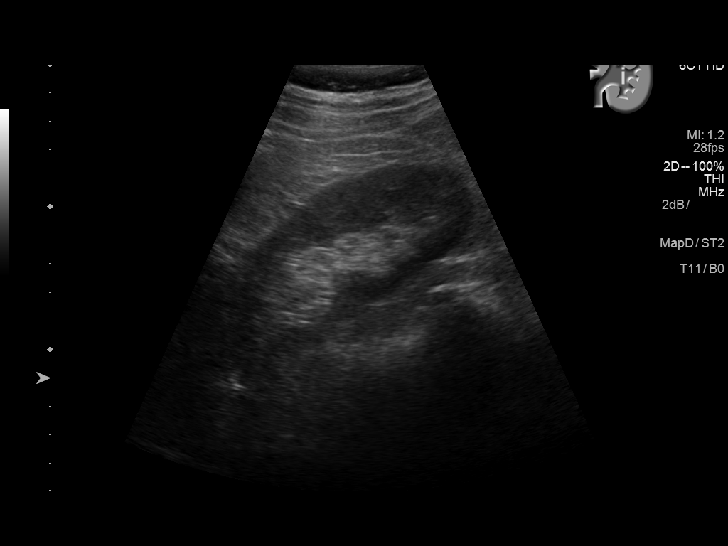
[im 69/83]
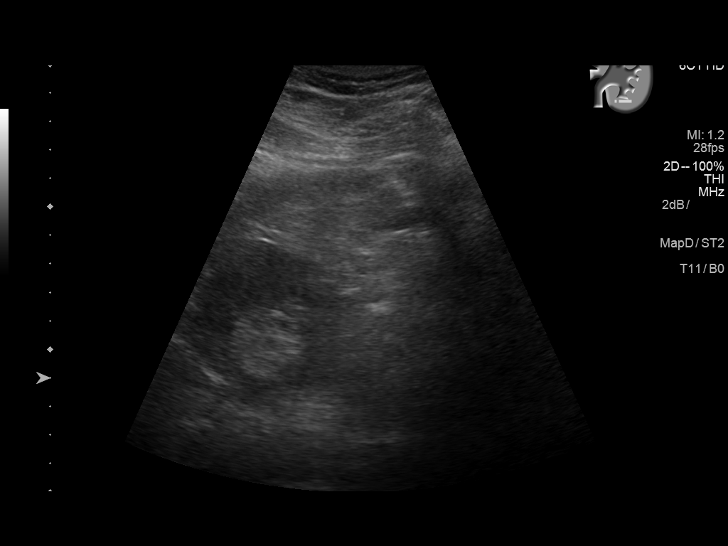
[im 76/83]
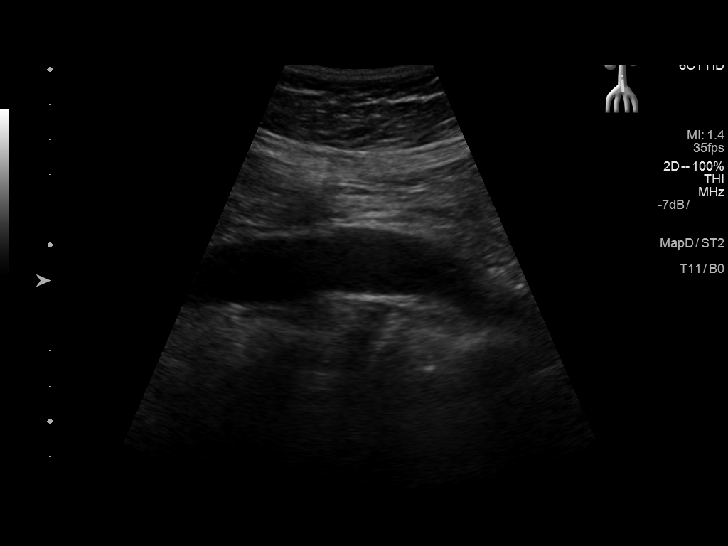
[im 83/83]
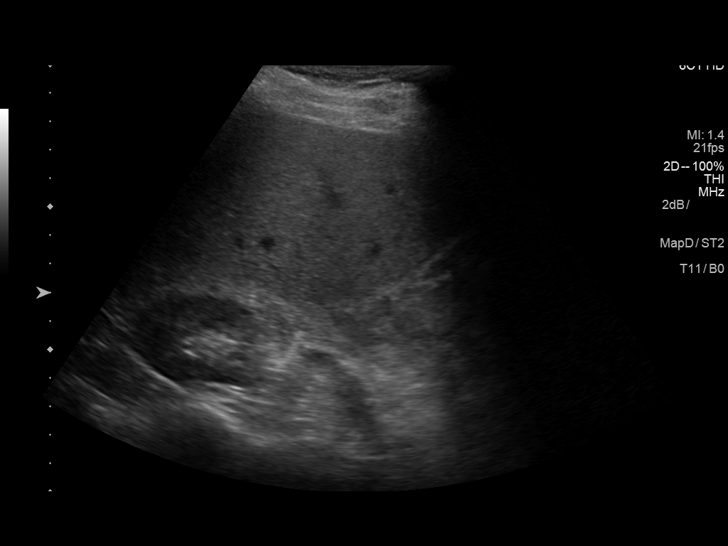

[13 of 25 positions shown; findings below may reference images not displayed]

FINDINGS: Gallbladder: No gallstones or wall thickening visualized. No
sonographic Murphy sign noted by sonographer.

Common bile duct: Diameter: 3.1 mm

Liver: No focal lesion identified. Within normal limits in
parenchymal echogenicity.

IVC: Normal where visualized

Pancreas: Bowel gas limits evaluation of the pancreatic tail and
distal body. The pancreatic head and proximal body appear normal.

Spleen: Size and appearance within normal limits.

Right Kidney: Length: 11.2 cm. Echogenicity within normal limits. No
mass or hydronephrosis visualized.

Left Kidney: Length: 11.4 cm. Echogenicity within normal limits. No
mass or hydronephrosis visualized.

Abdominal aorta: The maximal aortic dimension occurs proximally and
is 2.85 cm.

Other findings: There is no ascites.
IMPRESSION: 1. No evidence of acute cholecystitis nor other acute hepatobiliary
abnormality. If gallbladder dysfunction is suspected clinically, a
nuclear medicine hepatobiliary scan may be useful.
2. Ectatic abdominal aorta with maximal diameter of 2.9 cm
proximally. A dedicated abdominal aortic ultrasound would be useful
to establish a baseline.
3. No acute abnormality observed elsewhere within the abdomen.

## 2016-11-25 DIAGNOSIS — C4441 Basal cell carcinoma of skin of scalp and neck: Secondary | ICD-10-CM | POA: Diagnosis not present

## 2016-11-25 DIAGNOSIS — L905 Scar conditions and fibrosis of skin: Secondary | ICD-10-CM | POA: Diagnosis not present

## 2016-12-01 DIAGNOSIS — M9943 Connective tissue stenosis of neural canal of lumbar region: Secondary | ICD-10-CM | POA: Diagnosis not present

## 2016-12-03 ENCOUNTER — Other Ambulatory Visit: Payer: Self-pay | Admitting: Orthopedic Surgery

## 2016-12-03 DIAGNOSIS — M9943 Connective tissue stenosis of neural canal of lumbar region: Secondary | ICD-10-CM

## 2016-12-11 ENCOUNTER — Ambulatory Visit
Admission: RE | Admit: 2016-12-11 | Discharge: 2016-12-11 | Disposition: A | Discharge status: Home / Self Care | Payer: PPO | Source: Ambulatory Visit | Attending: Orthopedic Surgery | Admitting: Orthopedic Surgery

## 2016-12-11 DIAGNOSIS — M48061 Spinal stenosis, lumbar region without neurogenic claudication: Secondary | ICD-10-CM | POA: Diagnosis not present

## 2016-12-11 DIAGNOSIS — M9943 Connective tissue stenosis of neural canal of lumbar region: Secondary | ICD-10-CM | POA: Diagnosis not present

## 2016-12-11 DIAGNOSIS — M1288 Other specific arthropathies, not elsewhere classified, other specified site: Secondary | ICD-10-CM | POA: Insufficient documentation

## 2016-12-11 DIAGNOSIS — M545 Low back pain: Secondary | ICD-10-CM | POA: Diagnosis not present

## 2016-12-24 DIAGNOSIS — E785 Hyperlipidemia, unspecified: Secondary | ICD-10-CM | POA: Diagnosis not present

## 2016-12-24 DIAGNOSIS — Z125 Encounter for screening for malignant neoplasm of prostate: Secondary | ICD-10-CM | POA: Diagnosis not present

## 2016-12-31 DIAGNOSIS — G8929 Other chronic pain: Secondary | ICD-10-CM | POA: Diagnosis not present

## 2016-12-31 DIAGNOSIS — M5442 Lumbago with sciatica, left side: Secondary | ICD-10-CM | POA: Diagnosis not present

## 2016-12-31 DIAGNOSIS — M5441 Lumbago with sciatica, right side: Secondary | ICD-10-CM | POA: Diagnosis not present

## 2017-01-01 DIAGNOSIS — E785 Hyperlipidemia, unspecified: Secondary | ICD-10-CM | POA: Diagnosis not present

## 2017-01-01 DIAGNOSIS — K210 Gastro-esophageal reflux disease with esophagitis: Secondary | ICD-10-CM | POA: Diagnosis not present

## 2017-01-01 DIAGNOSIS — N400 Enlarged prostate without lower urinary tract symptoms: Secondary | ICD-10-CM | POA: Diagnosis not present

## 2017-01-01 DIAGNOSIS — M5134 Other intervertebral disc degeneration, thoracic region: Secondary | ICD-10-CM | POA: Diagnosis not present

## 2017-01-05 DIAGNOSIS — N3010 Interstitial cystitis (chronic) without hematuria: Secondary | ICD-10-CM | POA: Diagnosis not present

## 2017-01-05 DIAGNOSIS — R972 Elevated prostate specific antigen [PSA]: Secondary | ICD-10-CM | POA: Diagnosis not present

## 2017-01-05 DIAGNOSIS — N401 Enlarged prostate with lower urinary tract symptoms: Secondary | ICD-10-CM | POA: Diagnosis not present

## 2017-01-06 DIAGNOSIS — M5441 Lumbago with sciatica, right side: Secondary | ICD-10-CM | POA: Diagnosis not present

## 2017-01-06 DIAGNOSIS — M5442 Lumbago with sciatica, left side: Secondary | ICD-10-CM | POA: Diagnosis not present

## 2017-01-06 DIAGNOSIS — G8929 Other chronic pain: Secondary | ICD-10-CM | POA: Diagnosis not present

## 2017-01-08 DIAGNOSIS — M5441 Lumbago with sciatica, right side: Secondary | ICD-10-CM | POA: Diagnosis not present

## 2017-01-08 DIAGNOSIS — M5442 Lumbago with sciatica, left side: Secondary | ICD-10-CM | POA: Diagnosis not present

## 2017-01-08 DIAGNOSIS — G8929 Other chronic pain: Secondary | ICD-10-CM | POA: Diagnosis not present

## 2017-01-13 DIAGNOSIS — G8929 Other chronic pain: Secondary | ICD-10-CM | POA: Diagnosis not present

## 2017-01-13 DIAGNOSIS — M5441 Lumbago with sciatica, right side: Secondary | ICD-10-CM | POA: Diagnosis not present

## 2017-01-13 DIAGNOSIS — M5442 Lumbago with sciatica, left side: Secondary | ICD-10-CM | POA: Diagnosis not present

## 2017-01-15 DIAGNOSIS — G8929 Other chronic pain: Secondary | ICD-10-CM | POA: Diagnosis not present

## 2017-01-15 DIAGNOSIS — M5441 Lumbago with sciatica, right side: Secondary | ICD-10-CM | POA: Diagnosis not present

## 2017-01-15 DIAGNOSIS — M5442 Lumbago with sciatica, left side: Secondary | ICD-10-CM | POA: Diagnosis not present

## 2017-01-19 DIAGNOSIS — M5442 Lumbago with sciatica, left side: Secondary | ICD-10-CM | POA: Diagnosis not present

## 2017-01-19 DIAGNOSIS — M5441 Lumbago with sciatica, right side: Secondary | ICD-10-CM | POA: Diagnosis not present

## 2017-01-19 DIAGNOSIS — G8929 Other chronic pain: Secondary | ICD-10-CM | POA: Diagnosis not present

## 2017-01-22 DIAGNOSIS — R972 Elevated prostate specific antigen [PSA]: Secondary | ICD-10-CM | POA: Diagnosis not present

## 2017-01-22 DIAGNOSIS — N401 Enlarged prostate with lower urinary tract symptoms: Secondary | ICD-10-CM | POA: Diagnosis not present

## 2017-01-22 DIAGNOSIS — N3010 Interstitial cystitis (chronic) without hematuria: Secondary | ICD-10-CM | POA: Diagnosis not present

## 2017-01-23 DIAGNOSIS — G8929 Other chronic pain: Secondary | ICD-10-CM | POA: Diagnosis not present

## 2017-01-23 DIAGNOSIS — M5441 Lumbago with sciatica, right side: Secondary | ICD-10-CM | POA: Diagnosis not present

## 2017-01-23 DIAGNOSIS — M5442 Lumbago with sciatica, left side: Secondary | ICD-10-CM | POA: Diagnosis not present

## 2017-01-26 DIAGNOSIS — M5442 Lumbago with sciatica, left side: Secondary | ICD-10-CM | POA: Diagnosis not present

## 2017-01-26 DIAGNOSIS — M9943 Connective tissue stenosis of neural canal of lumbar region: Secondary | ICD-10-CM | POA: Diagnosis not present

## 2017-01-26 DIAGNOSIS — G8929 Other chronic pain: Secondary | ICD-10-CM | POA: Diagnosis not present

## 2017-01-26 DIAGNOSIS — M5441 Lumbago with sciatica, right side: Secondary | ICD-10-CM | POA: Diagnosis not present

## 2017-01-29 DIAGNOSIS — R972 Elevated prostate specific antigen [PSA]: Secondary | ICD-10-CM | POA: Diagnosis not present

## 2017-01-29 DIAGNOSIS — N3010 Interstitial cystitis (chronic) without hematuria: Secondary | ICD-10-CM | POA: Diagnosis not present

## 2017-01-29 DIAGNOSIS — N3 Acute cystitis: Secondary | ICD-10-CM | POA: Diagnosis not present

## 2017-01-29 DIAGNOSIS — R300 Dysuria: Secondary | ICD-10-CM | POA: Diagnosis not present

## 2017-02-10 DIAGNOSIS — R102 Pelvic and perineal pain: Secondary | ICD-10-CM | POA: Diagnosis not present

## 2017-02-10 DIAGNOSIS — N4231 Prostatic intraepithelial neoplasia: Secondary | ICD-10-CM | POA: Diagnosis not present

## 2017-02-10 DIAGNOSIS — R972 Elevated prostate specific antigen [PSA]: Secondary | ICD-10-CM | POA: Diagnosis not present

## 2017-02-23 DIAGNOSIS — N401 Enlarged prostate with lower urinary tract symptoms: Secondary | ICD-10-CM | POA: Diagnosis not present

## 2017-02-23 DIAGNOSIS — R972 Elevated prostate specific antigen [PSA]: Secondary | ICD-10-CM | POA: Diagnosis not present

## 2017-02-23 DIAGNOSIS — D400 Neoplasm of uncertain behavior of prostate: Secondary | ICD-10-CM | POA: Diagnosis not present

## 2017-02-23 DIAGNOSIS — N3010 Interstitial cystitis (chronic) without hematuria: Secondary | ICD-10-CM | POA: Diagnosis not present

## 2017-03-03 DIAGNOSIS — L814 Other melanin hyperpigmentation: Secondary | ICD-10-CM | POA: Diagnosis not present

## 2017-03-03 DIAGNOSIS — X32XXXA Exposure to sunlight, initial encounter: Secondary | ICD-10-CM | POA: Diagnosis not present

## 2017-03-03 DIAGNOSIS — Z85828 Personal history of other malignant neoplasm of skin: Secondary | ICD-10-CM | POA: Diagnosis not present

## 2017-03-03 DIAGNOSIS — L570 Actinic keratosis: Secondary | ICD-10-CM | POA: Diagnosis not present

## 2017-03-03 DIAGNOSIS — D225 Melanocytic nevi of trunk: Secondary | ICD-10-CM | POA: Diagnosis not present

## 2017-03-05 DIAGNOSIS — N401 Enlarged prostate with lower urinary tract symptoms: Secondary | ICD-10-CM | POA: Diagnosis not present

## 2017-03-05 DIAGNOSIS — N3010 Interstitial cystitis (chronic) without hematuria: Secondary | ICD-10-CM | POA: Diagnosis not present

## 2017-03-05 DIAGNOSIS — R972 Elevated prostate specific antigen [PSA]: Secondary | ICD-10-CM | POA: Diagnosis not present

## 2017-03-05 DIAGNOSIS — D400 Neoplasm of uncertain behavior of prostate: Secondary | ICD-10-CM | POA: Diagnosis not present

## 2017-03-11 DIAGNOSIS — N3010 Interstitial cystitis (chronic) without hematuria: Secondary | ICD-10-CM | POA: Diagnosis not present

## 2017-03-18 DIAGNOSIS — N3011 Interstitial cystitis (chronic) with hematuria: Secondary | ICD-10-CM | POA: Diagnosis not present

## 2017-03-27 DIAGNOSIS — N3010 Interstitial cystitis (chronic) without hematuria: Secondary | ICD-10-CM | POA: Diagnosis not present

## 2017-04-01 DIAGNOSIS — M5416 Radiculopathy, lumbar region: Secondary | ICD-10-CM | POA: Diagnosis not present

## 2017-04-01 DIAGNOSIS — M48062 Spinal stenosis, lumbar region with neurogenic claudication: Secondary | ICD-10-CM | POA: Diagnosis not present

## 2017-04-01 DIAGNOSIS — M5136 Other intervertebral disc degeneration, lumbar region: Secondary | ICD-10-CM | POA: Diagnosis not present

## 2017-04-13 DIAGNOSIS — N3090 Cystitis, unspecified without hematuria: Secondary | ICD-10-CM | POA: Diagnosis not present

## 2017-04-22 DIAGNOSIS — N3090 Cystitis, unspecified without hematuria: Secondary | ICD-10-CM | POA: Diagnosis not present

## 2017-04-28 DIAGNOSIS — N401 Enlarged prostate with lower urinary tract symptoms: Secondary | ICD-10-CM | POA: Diagnosis not present
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
## 2017-05-07 DIAGNOSIS — N401 Enlarged prostate with lower urinary tract symptoms: Secondary | ICD-10-CM | POA: Diagnosis not present

## 2017-05-08 NOTE — H&P (Signed)
NAME:  AZUL, BRUMETT              ACCOUNT NO.:  MEDICAL RECORD NO.:  [id number]  LOCATION:                                 FACILITY:  PHYSICIAN:  Maryan Puls          DATE OF BIRTH:  04/20/1941  DATE OF ADMISSION: DATE OF DISCHARGE:                            HISTORY AND PHYSICAL   SAME-DAY SURGERY:  September 2nd.  CHIEF COMPLAINT:  Difficulty voiding.  HISTORY OF PRESENT ILLNESS:  [name] is a 76 year old white male with a long history of lower urinary tract symptoms.  His obstructive symptoms became worse recently prompting evaluation.  He has a long history of interstitial cystitis and this is being managed with periodic bladder instillations with DMSO, bicarbonate, lidocaine, hydrocortisone, and heparin.  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  Uroflow study on September 11 indicated maximum flow rate of 10 mL/second with an average flow rate of 4.3 mL/second and a residual of 117 mL based upon a voided volume of 232 mL.  Prostate ultrasound on June 26 indicated a 68.4 g prostate with median lobe enlargement.  Most recent PSA was 5.7 ng/mL.  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  The patient comes in now for photovaporization of prostate with the GreenLight laser.  ALLERGIES:  THE PATIENT WAS ALLERGIC TO GEOCILLIN.  CURRENT MEDICATIONS:  Included omega-3 fish oil, coenzyme Q, biotin, red yeast rice, probiotic, stool softener, atorvastatin, and meloxicam.  SURGICAL HISTORY:  Inguinal herniorrhaphy in 1962 and 1970, TURP in 1998, transurethral resection of bladder neck contracture and hydrodilatation in 2010, ESWL in 2014.  PAST AND CURRENT MEDICAL CONDITIONS: 1. Hypercholesterolemia. 2. Interstitial cystitis.  SOCIAL HISTORY:  The patient denied tobacco or alcohol  use.  FAMILY HISTORY:  Father died at 69 of heart disease.  Mother died at age 66 of heart disease.  REVIEW OF SYSTEMS:  The patient denies chest pain, shortness of breath, diabetes, stroke, heart disease, or hypertension.  PHYSICAL EXAMINATION:  GENERAL:  A well-nourished white male in no acute distress. HEENT:  Sclerae were clear. NECK:  Neck was supple.  No palpable cervical adenopathy. LUNGS:  Clear to auscultation. CARDIOVASCULAR:  Regular rhythm and rate. ABDOMEN:  Soft, nontender abdomen. GU:  Circumcised testes, smooth, nontender. RECTAL:  Greater than 50 g smooth, nontender prostate. NEUROMUSCULAR:  Alert and oriented x3.  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  PLAN:  Photovaporization of prostate with GreenLight laser.          ______________________________ Maryan Puls     MW/MEDQ  D:  05/08/2017  T:  05/08/2017  Job:  [id number]

## 2017-05-11 ENCOUNTER — Encounter
Admission: RE | Admit: 2017-05-11 | Discharge: 2017-05-11 | Disposition: A | Discharge status: Home / Self Care | Payer: PPO | Source: Ambulatory Visit | Attending: Urology | Admitting: Urology

## 2017-05-11 DIAGNOSIS — Z01818 Encounter for other preprocedural examination: Secondary | ICD-10-CM | POA: Insufficient documentation

## 2017-05-11 DIAGNOSIS — I4510 Unspecified right bundle-branch block: Secondary | ICD-10-CM | POA: Insufficient documentation

## 2017-05-11 DIAGNOSIS — N400 Enlarged prostate without lower urinary tract symptoms: Secondary | ICD-10-CM | POA: Insufficient documentation

## 2017-05-11 DIAGNOSIS — I440 Atrioventricular block, first degree: Secondary | ICD-10-CM | POA: Insufficient documentation

## 2017-05-11 HISTORY — DX: Other intervertebral disc displacement, lumbar region: M51.26

## 2017-05-11 HISTORY — DX: Personal history of urinary calculi: Z87.442

## 2017-05-11 HISTORY — DX: Other intervertebral disc degeneration, lumbar region without mention of lumbar back pain or lower extremity pain: M51.369

## 2017-05-11 HISTORY — DX: Other intervertebral disc degeneration, lumbar region: M51.36

## 2017-05-11 HISTORY — DX: Malignant (primary) neoplasm, unspecified: C80.1

## 2017-05-11 HISTORY — DX: Unspecified osteoarthritis, unspecified site: M19.90

## 2017-05-11 NOTE — Patient Instructions (Addendum)
Your procedure is scheduled on: 05-19-17 TUESDAY Report to Same Day Surgery 2nd floor medical mall [facility] Entrance-take elevator on left to 2nd floor.  Check in with surgery information desk.) To find out your arrival time please call [phone] between 1PM - 3PM on 05-18-17 MONDAY  Remember: Instructions that are not followed completely may result in serious medical risk, up to and including death, or upon the discretion of your surgeon and anesthesiologist your surgery may need to be rescheduled.    _x___ 1. Do not eat food after midnight the night before your procedure. NO GUM CHEWING OR HARD CANDIES.  You may drink clear liquids up to 2 hours before you are scheduled to arrive at the hospital for your procedure.  Do not drink clear liquids within 2 hours of your scheduled arrival to the hospital.  Clear liquids include  --Water or Apple juice without pulp  --Clear carbohydrate beverage such as ClearFast or Gatorade  --Black Coffee or Clear Tea (No milk, no creamers, do not add anything to the coffee or Tea  Type 1 and type 2 diabetics should only drink water. .     __x__ 2. No Alcohol for 24 hours before or after surgery.   __x__3. No Smoking for 24 prior to surgery.   ____  4. Bring all medications with you on the day of surgery if instructed.    __x__ 5. Notify your doctor if there is any change in your medical condition     (cold, fever, infections).     Do not wear jewelry, make-up, hairpins, clips or nail polish.  Do not wear lotions, powders, or perfumes. You may wear deodorant.  Do not shave 48 hours prior to surgery. Men may shave face and neck.  Do not bring valuables to the hospital.    [facility] is not responsible for any belongings or valuables.               Contacts, dentures or bridgework may not be worn into surgery.  Leave your suitcase in the car. After surgery it may be brought to your room.  For patients admitted to the hospital, discharge time is  determined by your treatment team.   Patients discharged the day of surgery will not be allowed to drive home.  You will need someone to drive you home and stay with you the night of your procedure.    Please read over the following fact sheets that you were given:     _x___ [place] WITH A SMALL SIP OF WATER. These include:  1. PRILOSEC (OMEPRAZOLE)  2. TAKE AN EXTRA PRILOSEC Monday NIGHT BEFORE BED (05-18-17)  3.  4.  5.  6.  ____Fleets enema or Magnesium Citrate as directed.   ____ Use CHG Soap or sage wipes as directed on instruction sheet   ____ Use inhalers on the day of surgery and bring to hospital day of surgery  ____ Stop Metformin and Janumet 2 days prior to surgery.    ____ Take 1/2 of usual insulin dose the night before surgery and none on the morning surgery.   ____ Follow recommendations from Cardiologist, Pulmonologist or PCP regarding stopping Aspirin, Coumadin, Plavix ,Eliquis, Effient, or Pradaxa, and Pletal.  X____Stop Anti-inflammatories such as Advil, Aleve, Ibuprofen, Motrin, Naproxen, Naprosyn, Goodies powders or aspirin products NOW-OK to take Tylenol OR TRAMADOL   _x___ Stop supplements until after surgery-STOP OMEGA 3, RED YEAST RICE AND CO-Q 10 NOW-MAY  RESUME AFTER SURGERY   ____ Bring C-Pap to the hospital.

## 2017-05-13 ENCOUNTER — Encounter
Admission: RE | Admit: 2017-05-13 | Discharge: 2017-05-13 | Disposition: A | Discharge status: Home / Self Care | Payer: PPO | Source: Ambulatory Visit | Attending: Urology | Admitting: Urology

## 2017-05-13 DIAGNOSIS — I440 Atrioventricular block, first degree: Secondary | ICD-10-CM | POA: Diagnosis not present

## 2017-05-13 DIAGNOSIS — I4510 Unspecified right bundle-branch block: Secondary | ICD-10-CM | POA: Diagnosis not present

## 2017-05-13 DIAGNOSIS — N400 Enlarged prostate without lower urinary tract symptoms: Secondary | ICD-10-CM | POA: Diagnosis not present

## 2017-05-13 DIAGNOSIS — Z01818 Encounter for other preprocedural examination: Secondary | ICD-10-CM | POA: Diagnosis not present

## 2017-05-18 MED ORDER — GENTAMICIN SULFATE 40 MG/ML IJ SOLN
80.0000 mg | Freq: Once | INTRAMUSCULAR | Status: DC
  Filled 2017-05-18: qty 2

## 2017-05-18 MED ORDER — GENTAMICIN IN SALINE 1.6-0.9 MG/ML-% IV SOLN
80.0000 mg | Freq: Once | INTRAVENOUS | Status: AC
  Administered 2017-05-19: 80 mg via INTRAVENOUS
  Filled 2017-05-18: qty 50

## 2017-05-19 ENCOUNTER — Encounter: Payer: Self-pay | Admitting: Emergency Medicine

## 2017-05-19 ENCOUNTER — Ambulatory Visit: Payer: PPO | Admitting: Anesthesiology

## 2017-05-19 ENCOUNTER — Encounter
Admission: RE | Disposition: A | Discharge status: Home / Self Care | Payer: Self-pay | Source: Ambulatory Visit | Attending: Urology

## 2017-05-19 ENCOUNTER — Ambulatory Visit
Admission: RE | Admit: 2017-05-19 | Discharge: 2017-05-19 | Disposition: A | Discharge status: Home / Self Care | Payer: PPO | Source: Ambulatory Visit | Attending: Urology | Admitting: Urology

## 2017-05-19 DIAGNOSIS — N401 Enlarged prostate with lower urinary tract symptoms: Secondary | ICD-10-CM

## 2017-05-19 DIAGNOSIS — Z880 Allergy status to penicillin: Secondary | ICD-10-CM | POA: Insufficient documentation

## 2017-05-19 DIAGNOSIS — E7800 Pure hypercholesterolemia, unspecified: Secondary | ICD-10-CM | POA: Insufficient documentation

## 2017-05-19 DIAGNOSIS — Z79899 Other long term (current) drug therapy: Secondary | ICD-10-CM | POA: Insufficient documentation

## 2017-05-19 DIAGNOSIS — N3010 Interstitial cystitis (chronic) without hematuria: Secondary | ICD-10-CM | POA: Diagnosis not present

## 2017-05-19 DIAGNOSIS — N320 Bladder-neck obstruction: Secondary | ICD-10-CM | POA: Insufficient documentation

## 2017-05-19 DIAGNOSIS — K219 Gastro-esophageal reflux disease without esophagitis: Secondary | ICD-10-CM | POA: Insufficient documentation

## 2017-05-19 DIAGNOSIS — N138 Other obstructive and reflux uropathy: Secondary | ICD-10-CM | POA: Insufficient documentation

## 2017-05-19 DIAGNOSIS — N400 Enlarged prostate without lower urinary tract symptoms: Secondary | ICD-10-CM | POA: Diagnosis not present

## 2017-05-19 HISTORY — PX: GREEN LIGHT LASER TURP (TRANSURETHRAL RESECTION OF PROSTATE: SHX6260

## 2017-05-19 SURGERY — GREEN LIGHT LASER TURP (TRANSURETHRAL RESECTION OF PROSTATE
Anesthesia: General | Site: Prostate | Wound class: Clean Contaminated

## 2017-05-19 MED ORDER — MIDAZOLAM HCL 2 MG/2ML IJ SOLN
INTRAMUSCULAR | Status: DC | PRN
  Administered 2017-05-19: 2 mg via INTRAVENOUS

## 2017-05-19 MED ORDER — LIDOCAINE HCL (CARDIAC) 20 MG/ML IV SOLN
INTRAVENOUS | Status: DC | PRN
  Administered 2017-05-19: 40 mg via INTRAVENOUS

## 2017-05-19 MED ORDER — DEXAMETHASONE SODIUM PHOSPHATE 10 MG/ML IJ SOLN
INTRAMUSCULAR | Status: DC | PRN
  Administered 2017-05-19: 10 mg via INTRAVENOUS

## 2017-05-19 MED ORDER — MEPERIDINE HCL 50 MG/ML IJ SOLN: 6.2500 mg | INTRAMUSCULAR | Status: DC | PRN

## 2017-05-19 MED ORDER — OXYCODONE HCL 5 MG/5ML PO SOLN: 5.0000 mg | Freq: Once | ORAL | Status: AC | PRN

## 2017-05-19 MED ORDER — SULFAMETHOXAZOLE-TRIMETHOPRIM 800-160 MG PO TABS: 1.0000 | ORAL_TABLET | Freq: Two times a day (BID) | ORAL | 0 refills | Status: DC

## 2017-05-19 MED ORDER — MIDAZOLAM HCL 2 MG/2ML IJ SOLN
INTRAMUSCULAR | Status: AC
  Filled 2017-05-19: qty 2

## 2017-05-19 MED ORDER — FENTANYL CITRATE (PF) 100 MCG/2ML IJ SOLN
25.0000 ug | INTRAMUSCULAR | Status: DC | PRN
  Administered 2017-05-19 (×2): 50 ug via INTRAVENOUS

## 2017-05-19 MED ORDER — FENTANYL CITRATE (PF) 100 MCG/2ML IJ SOLN
INTRAMUSCULAR | Status: AC
  Administered 2017-05-19: 50 ug via INTRAVENOUS
  Filled 2017-05-19: qty 2

## 2017-05-19 MED ORDER — KETOROLAC TROMETHAMINE 30 MG/ML IJ SOLN
INTRAMUSCULAR | Status: AC
  Filled 2017-05-19: qty 1

## 2017-05-19 MED ORDER — FENTANYL CITRATE (PF) 100 MCG/2ML IJ SOLN
INTRAMUSCULAR | Status: DC | PRN
  Administered 2017-05-19 (×2): 50 ug via INTRAVENOUS

## 2017-05-19 MED ORDER — EPHEDRINE SULFATE 50 MG/ML IJ SOLN
INTRAMUSCULAR | Status: DC | PRN
  Administered 2017-05-19: 10 mg via INTRAVENOUS

## 2017-05-19 MED ORDER — ONDANSETRON HCL 4 MG/2ML IJ SOLN
INTRAMUSCULAR | Status: DC | PRN
  Administered 2017-05-19: 4 mg via INTRAVENOUS

## 2017-05-19 MED ORDER — LIDOCAINE HCL 2 % EX GEL
CUTANEOUS | Status: DC | PRN
  Administered 2017-05-19: 1 via URETHRAL

## 2017-05-19 MED ORDER — LIDOCAINE HCL (PF) 2 % IJ SOLN
INTRAMUSCULAR | Status: AC
  Filled 2017-05-19: qty 4

## 2017-05-19 MED ORDER — OXYCODONE HCL 5 MG PO TABS
ORAL_TABLET | ORAL | Status: AC
  Filled 2017-05-19: qty 1

## 2017-05-19 MED ORDER — KETOROLAC TROMETHAMINE 30 MG/ML IJ SOLN
INTRAMUSCULAR | Status: DC | PRN
  Administered 2017-05-19: 30 mg via INTRAVENOUS

## 2017-05-19 MED ORDER — FENTANYL CITRATE (PF) 100 MCG/2ML IJ SOLN
INTRAMUSCULAR | Status: AC
  Filled 2017-05-19: qty 2

## 2017-05-19 MED ORDER — LIDOCAINE HCL 2 % EX GEL
CUTANEOUS | Status: AC
  Filled 2017-05-19: qty 10

## 2017-05-19 MED ORDER — PROMETHAZINE HCL 25 MG/ML IJ SOLN: 6.2500 mg | INTRAMUSCULAR | Status: DC | PRN

## 2017-05-19 MED ORDER — OXYCODONE HCL 5 MG PO TABS
5.0000 mg | ORAL_TABLET | Freq: Once | ORAL | Status: AC | PRN
  Administered 2017-05-19: 5 mg via ORAL

## 2017-05-19 MED ORDER — PROPOFOL 10 MG/ML IV BOLUS
INTRAVENOUS | Status: DC | PRN
  Administered 2017-05-19: 150 mg via INTRAVENOUS

## 2017-05-19 MED ORDER — EPHEDRINE SULFATE 50 MG/ML IJ SOLN
INTRAMUSCULAR | Status: AC
  Filled 2017-05-19: qty 1

## 2017-05-19 MED ORDER — DEXAMETHASONE SODIUM PHOSPHATE 10 MG/ML IJ SOLN
INTRAMUSCULAR | Status: AC
  Filled 2017-05-19: qty 1

## 2017-05-19 MED ORDER — PROPOFOL 10 MG/ML IV BOLUS
INTRAVENOUS | Status: AC
  Filled 2017-05-19: qty 20

## 2017-05-19 MED ORDER — ONDANSETRON HCL 4 MG/2ML IJ SOLN
INTRAMUSCULAR | Status: AC
  Filled 2017-05-19: qty 2

## 2017-05-19 MED ORDER — LACTATED RINGERS IV SOLN
INTRAVENOUS | Status: DC
  Administered 2017-05-19 (×2): via INTRAVENOUS

## 2017-05-19 SURGICAL SUPPLY — 27 items
ADAPTER IRRIG TUBE 2 SPIKE SOL (ADAPTER) ×6 IMPLANT
BAG URO DRAIN 2000ML W/SPOUT (MISCELLANEOUS) ×3 IMPLANT
CATH FOLEY 2WAY  5CC 20FR SIL (CATHETERS) ×2
CATH FOLEY 2WAY 5CC 20FR SIL (CATHETERS) ×1 IMPLANT
GLOVE BIO SURGEON STRL SZ7 (GLOVE) IMPLANT
GLOVE BIO SURGEON STRL SZ7.5 (GLOVE) ×3 IMPLANT
GOWN STRL REUS W/ TWL LRG LVL3 (GOWN DISPOSABLE) ×1 IMPLANT
GOWN STRL REUS W/ TWL XL LVL3 (GOWN DISPOSABLE) ×1 IMPLANT
GOWN STRL REUS W/TWL LRG LVL3 (GOWN DISPOSABLE) ×2
GOWN STRL REUS W/TWL XL LVL3 (GOWN DISPOSABLE) ×2
IV NS 1000ML (IV SOLUTION) ×2
IV NS 1000ML BAXH (IV SOLUTION) ×1 IMPLANT
IV SET PRIMARY 15D 139IN B9900 (IV SETS) ×3 IMPLANT
KIT RM TURNOVER CYSTO AR (KITS) ×3 IMPLANT
LASER GREENLIGHT XPS PROCEDURE (MISCELLANEOUS) ×3 IMPLANT
LASER GRNLGT MOXY FIBER 750UM (MISCELLANEOUS) ×3 IMPLANT
PACK CYSTO AR (MISCELLANEOUS) ×3 IMPLANT
SET IRRIG Y TYPE TUR BLADDER L (SET/KITS/TRAYS/PACK) ×3 IMPLANT
SOL .9 NS 3000ML IRR  AL (IV SOLUTION) ×8
SOL .9 NS 3000ML IRR UROMATIC (IV SOLUTION) ×4 IMPLANT
SOL PREP PVP 2OZ (MISCELLANEOUS) ×3
SOLUTION PREP PVP 2OZ (MISCELLANEOUS) ×1 IMPLANT
SURGILUBE 2OZ TUBE FLIPTOP (MISCELLANEOUS) ×3 IMPLANT
SYRINGE IRR TOOMEY STRL 70CC (SYRINGE) ×3 IMPLANT
TUBING CONNECTING 10 (TUBING) IMPLANT
TUBING CONNECTING 10' (TUBING)
WATER STERILE IRR 1000ML POUR (IV SOLUTION) ×3 IMPLANT

## 2017-05-19 NOTE — Transfer of Care (Signed)
Immediate Anesthesia Transfer of Care Note  Patient: Reginald Cobb  Procedure(s) Performed: GREEN LIGHT LASER TURP (TRANSURETHRAL RESECTION OF PROSTATE (N/A Prostate)  Patient Location: PACU  Anesthesia Type:General  Level of Consciousness: drowsy and patient cooperative  Airway & Oxygen Therapy: Patient Spontanous Breathing and Patient connected to face mask oxygen  Post-op Assessment: Report given to RN and Post -op Vital signs reviewed and stable  Post vital signs: Reviewed and stable  Last Vitals:  Vitals:   05/19/17 1158  BP: (!) 146/75  Pulse: 72  Resp: 17  Temp: 36.7 C  SpO2: 100%    Last Pain:  Vitals:   05/19/17 1158  TempSrc: Oral         Complications: No apparent anesthesia complications

## 2017-05-19 NOTE — Op Note (Signed)
Preoperative diagnosis: BPH with bladder outlet obstruction  Postoperative diagnosis: Same   Procedure: Photo vaporization of the prostate with greenlight laser    Surgeon: Otelia Limes. Yves Dill MD  Anesthesia: General  Indications:See the history and physical. After informed consent the above procedure(s) were requested     Technique and findings: After adequate general anesthesia been obtained the patient was placed into dorsal lithotomy position and the perineum was prepped and draped in the usual fashion. The Laserscope was coupled to the camera and visually advanced into the bladder. The bladder was thoroughly inspected and no lesions were identified. The patient had median lobe prostatic hypertrophy with visual obstruction. Both ureteral orifices were identified and had clear efflux. The greenlight XPS laser fiber was passed through the scope and set at a power setting of 92 W. The median lobe tissue was vaporized. Power was increased to 120 W and additional obstructing tissue was vaporized from the bladder neck to the verumontanum. The power was then increased to 180 W and any remaining obstructive tissue was vaporized. Bleeders were coagulated. The cystoscope was then removed and 10 cc of viscous Xylocaine instilled within the urethra. A 20 French silicone catheter was placed and irrigated until clear. Estimated blood loss was 50 cc. The patient was then transported to the recovery room in stable condition.

## 2017-05-19 NOTE — Discharge Instructions (Signed)
Benign Prostatic Hyperplasia Benign prostatic hyperplasia is when the prostate gland is bigger than normal (enlarged). The prostate is a gland that produces the fluid that goes into semen. It is near the opening to the bladder and it surrounds the tube that drains urine out of the body (urethra). Benign prostatic hyperplasia is common among older men and it typically causes problems with urinating. The prostate grows slowly as you age. As the prostate grows, it can pinch the urethra. This causes the bladder to work too hard to pass urine, which leads to a thickened bladder wall. The bladder may eventually become weak and unable to empty completely. What are the causes? The exact cause of this condition is not known. It may be related to changes in hormones as the body ages. What increases the risk? You are more likely to develop this condition if:  You have a family history of the condition.  You are age 70 or older.  You have a history of erectile dysfunction.  You do not exercise.  You have certain medical conditions, including: ? Type 2 diabetes. ? Obesity. ? Heart and circulatory disease.  What are the signs or symptoms? Symptoms of this condition include:  Weak or interrupted urine stream.  Dribbling or leaking urine.  Feeling like the bladder has not emptied completely.  Difficulty starting urination.  Getting up frequently at night to urinate.  Urinating more often (8 or more times a day).  Accidental loss of urine (urinary incontinence).  Pain during urination or ejaculation.  Urine with an unusual smell or color.  The size of the prostate does not always determine the severity of the symptoms. For example, a man with a large prostate may experience minor symptoms, or a man with a smaller prostate may experience a severe blockage. How is this diagnosed? This condition may be diagnosed based on:  Your medical history and symptoms.  A physical exam. This usually  includes a digital rectal exam. During this exam, your health care provider places a gloved, lubricated finger into the rectum to feel the size of the prostate.  A blood test. This test checks for high levels of a protein that is produced by the prostate (prostate specific antigen, PSA).  Tests to examine how well the urethra and bladder are functioning (urodynamic tests).  Cystoscopy. For this test, a small, tube-shaped instrument (cystoscope) is used to look inside the urethra and bladder. The cystoscope is placed into the urinary tract through the opening at the tip of the penis.  Urine tests.  Ultrasound.  How is this treated? Treatment for this condition depends on how severe your symptoms are. Treatment may include:  Active surveillance or "watchful waiting." If your symptoms are mild, your health care provider may delay treatment and ask you to keep track of your symptoms. You will have regular checkups to examine the size of your prostate, discuss symptoms, and determine whether treatment is needed.  Medicines. These may be used to: ? Stop prostate growth. ? Shrink the prostate. ? Relieve symptoms.  Lifestyle changes, including: ? Pelvic floor muscle exercises. The pelvic floor muscles are a group of muscles that relax when you urinate. ? Bladder training. This involves exercises that train the bladder to hold more urine for longer periods. ? Reducing the amount of liquid that you drink. This is especially important before sleeping and before long periods of time spent in public. ? Reducing the amount of caffeine and alcohol that you drink. ? Treating or  preventing constipation.  Surgery to reduce the size of the prostate or widen the urethra. This is typically done if your symptoms are severe or there are serious complications from the enlarged prostate.  Follow these instructions at home: Medicines  Take over-the-counter and prescription medicines as told by your health  care provider.  Avoid certain medicines, such as decongestants, antihistamines, and some prescription medicines as told by your health care provider. Ask your health care provider which medicines you should avoid. General instructions  Monitor your symptoms for any changes. Tell your health care provider about any changes.  Give yourself time when you urinate.  Avoid certain beverages that can irritate the bladder, such as: ? Alcohol. ? Caffeinated drinks like coffee, tea, and cola.  Avoid drinking large amounts of liquid before bed or before going out in public.  Do pelvic floor muscle or bladder training exercises as told by your health care provider.  Keep all follow-up visits as told by your health care provider. This is important. Contact a health care provider if:  Your develop new or worse symptoms.  You have trouble getting or maintaining an erection.  You have a fever.  You have pain or burning during urination.  You have blood in your urine. Get help right away if:  You have severe pain when urinating.  You cannot urinate.  You have severe pain in your abdomen.  You are dizzy.  You faint.  You have severe back pain.  Your urine is dark red and difficult to see through.  You have large blood clots in your urine.  You have severe pain after an erection.  You have chest pain, dizziness, or nausea during sexual activity. Summary  The prostate is a gland that produces the fluid that goes into semen. It is near the opening to the bladder and it surrounds the tube that drains urine out of the body (urethra).  Benign prostatic hyperplasia is common among older men and it typically causes problems with urinating.  If your symptoms are mild, your health care provider may delay treatment and ask you to keep track of your symptoms. You will have regular checkups to examine the size of your prostate, discuss symptoms, and determine whether treatment is  needed.  If directed, you may need to avoid certain medicines, such as decongestants, antihistamines, and some prescription medicines.  Contact your health care provider if you develop new or worse symptoms. This information is not intended to replace advice given to you by your health care provider. Make sure you discuss any questions you have with your health care provider. Document Released: 08/04/2005 Document Revised: 06/23/2016 Document Reviewed: 06/23/2016 Elsevier Interactive Patient Education  2017 [employer] Light Laser Prostate Treatment Green light laser therapy is a procedure that uses a high-energy laser to get rid of extra prostate tissue by turning the tissue into a vapor. It is less invasive than traditional methods of prostate surgery, which involve cutting out the prostate tissue. Because the tissue is turned into a vapor (vaporized) rather than cut out, there is generally less blood loss. This surgery is used to treat an enlarged prostate gland (benign prostatic hyperplasia). Tell a health care provider about:  Any allergies you have.  All medicines you are taking, including vitamins, herbs, eye drops, creams, and over-the-counter medicines.  Any problems you or family members have had with anesthetic medicines.  Any blood disorders you have.  Any surgeries you have had.  Any medical conditions  you have. What are the risks? Generally, this is a safe procedure. However, problems may occur, including:  Infection.  Bleeding.  Allergic reaction to medicines.  Damage to other structures or organs.  Blood in the urine (hematuria).  Painful urination.  Urinary tract infection.  Erectile dysfunction (rare).  Dry ejaculation.  Scar tissue in the urinary passage.  What happens before the procedure? Staying hydrated Follow instructions from your health care provider about hydration, which may include:  Up to 2 hours before the procedure - you  may continue to drink clear liquids, such as water, clear fruit juice, black coffee, and plain tea.  Eating and drinking restrictions Follow instructions from your health care provider about eating and drinking, which may include:  8 hours before the procedure - stop eating heavy meals or foods such as meat, fried foods, or fatty foods.  6 hours before the procedure - stop eating light meals or foods, such as toast or cereal.  6 hours before the procedure - stop drinking milk or drinks that contain milk.  2 hours before the procedure - stop drinking clear liquids.  Medicines  Ask your health care provider about: ? Changing or stopping your regular medicines. This is especially important if you are taking diabetes medicines or blood thinners. ? Taking medicines such as aspirin and ibuprofen. These medicines can thin your blood. Do not take these medicines before your procedure if your health care provider instructs you not to.  You may be prescribed antibiotic medicine. If so, take your antibiotic as told by your health care provider. Do not stop taking the antibiotic even if you start to feel better. General instructions  Plan to have someone take you home from the hospital or clinic.  If you will be going home right after the procedure, plan to have someone with you for 24 hours. What happens during the procedure?  To reduce your risk of infection: ? Your health care team will wash or sanitize their hands. ? Your skin will be washed with soap.  You will be given one or more of the following: ? A medicine to help you relax (sedative). ? A medicine to make you fall asleep (general anesthetic). ? A medicine that is injected into your spine to numb the area below and slightly above the injection site (spinal anesthetic).  A tube containing viewing scopes and instruments (fiber-optic scope) will be inserted through your penis.  A thin fiber will be put through the tube and  positioned next to the extra prostate tissue.  Pulses of laser light will come from the end of the fiber and be projected onto the extra tissue. Your blood will absorb the light, become hot, and vaporize the extra prostate tissue.  The heat from the laser beam will seal off the blood vessels, which will lessen bleeding.  The fiber-optic scope will be removed and replaced with a temporary tube (catheter) that is used to help urine flow. The procedure may vary among health care providers and hospitals. What happens after the procedure?  Your blood pressure, heart rate, breathing rate, and blood oxygen level will be monitored until the medicines you were given have worn off.  Depending on factors such as the amount of prostate tissue that was vaporized, the strength of your bladder, and the amount of bleeding expected, your catheter may be removed.  You may be given elastic support stockings to wear to help prevent blood clots in your legs.  Do not drive for  24 hours if you were given a sedative, or for as long as directed by your health care provider. Summary  Green light laser therapy is a procedure that uses a high-energy laser that turns extra prostate tissue into a vapor.  This procedure is less invasive than traditional methods of prostate surgery.  Follow instructions from your health care provider about eating and drinking before the procedure.  Pulses of laser light will come from the end of a thin fiber and be aimed at the extra prostate tissue. Your blood will absorb the light, become hot, and vaporize the extra tissue. This information is not intended to replace advice given to you by your health care provider. Make sure you discuss any questions you have with your health care provider. Document Released: 11/11/2007 Document Revised: 08/23/2016 Document Reviewed: 08/23/2016 Elsevier Interactive Patient Education  2017 [facility], Care After This  sheet gives you information about how to care for yourself after your procedure. Your health care provider may also give you more specific instructions. If you have problems or questions, contact your health care provider. What can I expect after the procedure? For the first few weeks after the procedure:  You will feel a need to urinate often.  You may have blood in your urine.  You may feel a sudden need to urinate.  Once your urinary catheter is removed, you may have a burning feeling when you urinate, especially at the end of urination. This feeling usually passes within 3-5 days. Follow these instructions at home: Activity  Return to your normal activities as told by your health care provider. Ask your health care provider what activities are safe for you.  Do not do vigorous exercise for 1 week or as told by your health care provider.  Do not lift anything that is heavier than 10 lb (4.5 kg) until your health care provider say it is safe.  Avoid sexual activity for 4-6 weeks or as told by your health care provider.  Do not ride in a car for extended periods of time for 1 month or as told by your health care provider.  Do not drive for 24 hours if you were given a medicine to help you relax (sedative). Diet  Eat foods that are high in fiber, such as fresh fruits and vegetables, whole grains, and beans.  Drink enough fluid to keep your urine clear or pale yellow. Medicines  Take over-the-counter and prescription medicines, including stool softeners, only as told by your health care provider.  If you were prescribed an antibiotic medicine, take it as told by your health care provider. Do not stop taking the antibiotic even if you start to feel better. General instructions  If you were given elastic support stockings, wear them as told by your health care provider.  Do not strain to have a bowel movement. Straining may lead to bleeding from the prostate and cause clots to  form and cause trouble urinating.  Keep all follow-up visits as told by your health care provider. This is important. Contact a health care provider if:  You have a fever or chills.  You have spasms or pain with the urinary catheter still in place.  Once the catheter has been removed, you experience difficulty starting your stream when attempting to urinate. Get help right away if:  There is a blockage in your catheter.  Your catheter has been removed and you are suddenly unable to urinate.  Your urine smells  unusually bad.  You start to have blood clots in your urine.  The blood in your urine becomes persistent or gets thick.  You develop chest pains.  You develop shortness of breath.  You develop swelling or pain in your leg. Summary  You may notice urinary symptoms for a few weeks after your procedure.  Follow instructions from your health care provider regarding activity restrictions such as lifting, exercise, and sexual activity.  Contact your health care provider if you have any unusual symptoms during your recovery. This information is not intended to replace advice given to you by your health care provider. Make sure you discuss any questions you have with your health care provider. Document Released: 08/04/2005 Document Revised: 03/21/2016 Document Reviewed: 03/21/2016 Elsevier Interactive Patient Education  2018 [employer].

## 2017-05-19 NOTE — Anesthesia Post-op Follow-up Note (Signed)
Anesthesia QCDR form completed.        

## 2017-05-19 NOTE — Anesthesia Postprocedure Evaluation (Signed)
Anesthesia Post Note  Patient: Reginald Cobb  Procedure(s) Performed: GREEN LIGHT LASER TURP (TRANSURETHRAL RESECTION OF PROSTATE (N/A Prostate)  Patient location during evaluation: PACU Anesthesia Type: General Level of consciousness: awake and alert Pain management: pain level controlled Vital Signs Assessment: post-procedure vital signs reviewed and stable Respiratory status: spontaneous breathing and respiratory function stable Cardiovascular status: stable Anesthetic complications: no     Last Vitals:  Vitals:   05/19/17 1158 05/19/17 1533  BP: (!) 146/75 105/61  Pulse: 72 70  Resp: 17 11  Temp: 36.7 C 36.6 C  SpO2: 100% 99%    Last Pain:  Vitals:   05/19/17 1158  TempSrc: Oral                 KEPHART,WILLIAM K

## 2017-05-19 NOTE — Anesthesia Preprocedure Evaluation (Signed)
Anesthesia Evaluation  Patient identified by MRN, date of birth, ID band Patient awake    Reviewed: Allergy & Precautions, NPO status , Patient's Chart, lab work & pertinent test results  History of Anesthesia Complications Negative for: history of anesthetic complications  Airway Mallampati: II  TM Distance: >3 FB Neck ROM: Full    Dental no notable dental hx.    Pulmonary neg pulmonary ROS, neg sleep apnea, neg COPD,    breath sounds clear to auscultation- rhonchi (-) wheezing      Cardiovascular (-) hypertension(-) CAD, (-) Past MI and (-) Cardiac Stents  Rhythm:Regular Rate:Normal - Systolic murmurs and - Diastolic murmurs    Neuro/Psych negative neurological ROS  negative psych ROS   GI/Hepatic Neg liver ROS, GERD  ,  Endo/Other  negative endocrine ROSneg diabetes  Renal/GU negative Renal ROS     Musculoskeletal  (+) Arthritis ,   Abdominal (+) - obese,   Peds  Hematology negative hematology ROS (+)   Anesthesia Other Findings Past Medical History: No date: Arthritis No date: BPH (benign prostatic hyperplasia) No date: Bulging lumbar disc No date: Cancer (HCC)     Comment:  SKIN CANCER-BASAL CELL No date: GERD (gastroesophageal reflux disease) No date: Hemorrhoids No date: History of colon polyps No date: History of kidney stones No date: Hyperlipidemia No date: Interstitial cystitis     Comment:  RECEIVES BLADDER INJECTIONS EVERY 6 MONTHS No date: Interstitial cystitis No date: Prostatitis   Reproductive/Obstetrics                             Anesthesia Physical Anesthesia Plan  ASA: II  Anesthesia Plan: General   Post-op Pain Management:    Induction: Intravenous  PONV Risk Score and Plan: 1 and Ondansetron and Dexamethasone  Airway Management Planned: LMA  Additional Equipment:   Intra-op Plan:   Post-operative Plan:   Informed Consent: I have reviewed  the patients History and Physical, chart, labs and discussed the procedure including the risks, benefits and alternatives for the proposed anesthesia with the patient or authorized representative who has indicated his/her understanding and acceptance.   Dental advisory given  Plan Discussed with: CRNA and Anesthesiologist  Anesthesia Plan Comments:         Anesthesia Quick Evaluation

## 2017-05-19 NOTE — H&P (Signed)
Date of Initial H&P: 05/08/17  History reviewed, patient examined, no change in status, stable for surgery.

## 2017-05-19 NOTE — Anesthesia Procedure Notes (Signed)
Procedure Name: LMA Insertion Date/Time: 05/19/2017 1:56 PM Performed by: Silvana Newness Pre-anesthesia Checklist: Patient identified, Emergency Drugs available, Suction available, Patient being monitored and Timeout performed Patient Re-evaluated:Patient Re-evaluated prior to induction Oxygen Delivery Method: Circle system utilized Preoxygenation: Pre-oxygenation with 100% oxygen Induction Type: IV induction Ventilation: Mask ventilation without difficulty LMA: LMA inserted LMA Size: 4.0 Number of attempts: 1 Placement Confirmation: positive ETCO2 and breath sounds checked- equal and bilateral Tube secured with: Tape Dental Injury: Teeth and Oropharynx as per pre-operative assessment

## 2017-05-20 ENCOUNTER — Encounter: Payer: Self-pay | Admitting: Urology

## 2017-06-02 DIAGNOSIS — M5489 Other dorsalgia: Secondary | ICD-10-CM | POA: Diagnosis not present

## 2017-06-02 DIAGNOSIS — N401 Enlarged prostate with lower urinary tract symptoms: Secondary | ICD-10-CM | POA: Diagnosis not present

## 2017-06-02 DIAGNOSIS — D400 Neoplasm of uncertain behavior of prostate: Secondary | ICD-10-CM | POA: Diagnosis not present

## 2017-06-02 DIAGNOSIS — N3010 Interstitial cystitis (chronic) without hematuria: Secondary | ICD-10-CM | POA: Diagnosis not present

## 2017-06-17 DIAGNOSIS — E785 Hyperlipidemia, unspecified: Secondary | ICD-10-CM | POA: Diagnosis not present

## 2017-06-17 DIAGNOSIS — Z125 Encounter for screening for malignant neoplasm of prostate: Secondary | ICD-10-CM | POA: Diagnosis not present

## 2017-06-25 DIAGNOSIS — R300 Dysuria: Secondary | ICD-10-CM | POA: Diagnosis not present

## 2017-06-25 DIAGNOSIS — R3129 Other microscopic hematuria: Secondary | ICD-10-CM | POA: Diagnosis not present

## 2017-06-25 DIAGNOSIS — N3021 Other chronic cystitis with hematuria: Secondary | ICD-10-CM | POA: Diagnosis not present

## 2017-06-25 DIAGNOSIS — N390 Urinary tract infection, site not specified: Secondary | ICD-10-CM | POA: Diagnosis not present

## 2017-07-02 DIAGNOSIS — R972 Elevated prostate specific antigen [PSA]: Secondary | ICD-10-CM | POA: Diagnosis not present

## 2017-07-02 DIAGNOSIS — N401 Enlarged prostate with lower urinary tract symptoms: Secondary | ICD-10-CM | POA: Diagnosis not present

## 2017-07-02 DIAGNOSIS — N3281 Overactive bladder: Secondary | ICD-10-CM | POA: Diagnosis not present

## 2017-07-02 DIAGNOSIS — R300 Dysuria: Secondary | ICD-10-CM | POA: Diagnosis not present

## 2017-07-02 DIAGNOSIS — N3011 Interstitial cystitis (chronic) with hematuria: Secondary | ICD-10-CM | POA: Diagnosis not present

## 2017-07-16 DIAGNOSIS — M48062 Spinal stenosis, lumbar region with neurogenic claudication: Secondary | ICD-10-CM | POA: Diagnosis not present

## 2017-07-16 DIAGNOSIS — M5136 Other intervertebral disc degeneration, lumbar region: Secondary | ICD-10-CM | POA: Diagnosis not present

## 2017-07-16 DIAGNOSIS — M5416 Radiculopathy, lumbar region: Secondary | ICD-10-CM | POA: Diagnosis not present

## 2017-09-11 DIAGNOSIS — G9009 Other idiopathic peripheral autonomic neuropathy: Secondary | ICD-10-CM | POA: Diagnosis not present

## 2017-09-11 DIAGNOSIS — G589 Mononeuropathy, unspecified: Secondary | ICD-10-CM | POA: Diagnosis not present

## 2017-09-17 DIAGNOSIS — N401 Enlarged prostate with lower urinary tract symptoms: Secondary | ICD-10-CM | POA: Diagnosis not present

## 2017-09-17 DIAGNOSIS — R300 Dysuria: Secondary | ICD-10-CM | POA: Diagnosis not present

## 2017-09-17 DIAGNOSIS — N3011 Interstitial cystitis (chronic) with hematuria: Secondary | ICD-10-CM | POA: Diagnosis not present

## 2017-09-17 DIAGNOSIS — R972 Elevated prostate specific antigen [PSA]: Secondary | ICD-10-CM | POA: Diagnosis not present

## 2017-09-17 DIAGNOSIS — D400 Neoplasm of uncertain behavior of prostate: Secondary | ICD-10-CM | POA: Diagnosis not present

## 2017-10-21 DIAGNOSIS — M5134 Other intervertebral disc degeneration, thoracic region: Secondary | ICD-10-CM | POA: Diagnosis not present

## 2017-10-21 DIAGNOSIS — M792 Neuralgia and neuritis, unspecified: Secondary | ICD-10-CM | POA: Diagnosis not present

## 2017-11-10 DIAGNOSIS — D2261 Melanocytic nevi of right upper limb, including shoulder: Secondary | ICD-10-CM | POA: Diagnosis not present

## 2017-11-10 DIAGNOSIS — D225 Melanocytic nevi of trunk: Secondary | ICD-10-CM | POA: Diagnosis not present

## 2017-11-10 DIAGNOSIS — X32XXXA Exposure to sunlight, initial encounter: Secondary | ICD-10-CM | POA: Diagnosis not present

## 2017-11-10 DIAGNOSIS — Z85828 Personal history of other malignant neoplasm of skin: Secondary | ICD-10-CM | POA: Diagnosis not present

## 2017-11-10 DIAGNOSIS — L570 Actinic keratosis: Secondary | ICD-10-CM | POA: Diagnosis not present

## 2017-11-10 DIAGNOSIS — D2271 Melanocytic nevi of right lower limb, including hip: Secondary | ICD-10-CM | POA: Diagnosis not present

## 2017-12-25 DIAGNOSIS — E785 Hyperlipidemia, unspecified: Secondary | ICD-10-CM | POA: Diagnosis not present

## 2017-12-29 DIAGNOSIS — M5134 Other intervertebral disc degeneration, thoracic region: Secondary | ICD-10-CM | POA: Diagnosis not present

## 2017-12-29 DIAGNOSIS — E785 Hyperlipidemia, unspecified: Secondary | ICD-10-CM | POA: Diagnosis not present

## 2018-01-04 DIAGNOSIS — G5603 Carpal tunnel syndrome, bilateral upper limbs: Secondary | ICD-10-CM | POA: Diagnosis not present

## 2018-01-18 DIAGNOSIS — R202 Paresthesia of skin: Secondary | ICD-10-CM | POA: Diagnosis not present

## 2018-01-18 DIAGNOSIS — R200 Anesthesia of skin: Secondary | ICD-10-CM | POA: Diagnosis not present

## 2018-02-01 DIAGNOSIS — G5603 Carpal tunnel syndrome, bilateral upper limbs: Secondary | ICD-10-CM | POA: Diagnosis not present

## 2018-02-01 DIAGNOSIS — G629 Polyneuropathy, unspecified: Secondary | ICD-10-CM | POA: Diagnosis not present

## 2018-02-02 ENCOUNTER — Encounter: Payer: Self-pay | Admitting: Occupational Therapy

## 2018-02-02 ENCOUNTER — Ambulatory Visit: Payer: PPO | Attending: Orthopedic Surgery | Admitting: Occupational Therapy

## 2018-02-02 ENCOUNTER — Other Ambulatory Visit: Payer: Self-pay

## 2018-02-02 DIAGNOSIS — M25532 Pain in left wrist: Secondary | ICD-10-CM | POA: Diagnosis not present

## 2018-02-02 DIAGNOSIS — M6281 Muscle weakness (generalized): Secondary | ICD-10-CM | POA: Diagnosis not present

## 2018-02-02 DIAGNOSIS — G5603 Carpal tunnel syndrome, bilateral upper limbs: Secondary | ICD-10-CM | POA: Diagnosis not present

## 2018-02-02 DIAGNOSIS — M25531 Pain in right wrist: Secondary | ICD-10-CM | POA: Diagnosis not present

## 2018-02-04 ENCOUNTER — Ambulatory Visit: Payer: PPO | Admitting: Occupational Therapy

## 2018-02-04 DIAGNOSIS — M25531 Pain in right wrist: Secondary | ICD-10-CM | POA: Diagnosis not present

## 2018-02-04 DIAGNOSIS — G5603 Carpal tunnel syndrome, bilateral upper limbs: Secondary | ICD-10-CM

## 2018-02-04 DIAGNOSIS — M25532 Pain in left wrist: Secondary | ICD-10-CM

## 2018-02-04 DIAGNOSIS — Z86010 Personal history of colonic polyps: Secondary | ICD-10-CM | POA: Diagnosis not present

## 2018-02-04 DIAGNOSIS — M6281 Muscle weakness (generalized): Secondary | ICD-10-CM

## 2018-02-06 ENCOUNTER — Encounter: Payer: Self-pay | Admitting: Occupational Therapy

## 2018-02-06 NOTE — Therapy (Signed)
[facility] [address] [place], [address] Phone: [phone]   Fax:  [phone]  Occupational Therapy Evaluation  Patient Details  Name: Reginald Cobb MRN: [id number] Date of Birth: 11/17/1940 Referring Provider: Rudene Christians   Encounter Date: 02/02/2018  OT End of Session - 02/06/18 1506    Visit Number  1    Number of Visits  6    Date for OT Re-Evaluation  02/26/18    OT Start Time  1531    OT Stop Time  1630    OT Time Calculation (min)  59 min    Activity Tolerance  Patient tolerated treatment well    Behavior During Therapy  [facility] for tasks assessed/performed       Past Medical History:  Diagnosis Date  . Arthritis   . BPH (benign prostatic hyperplasia)   . Bulging lumbar disc   . Cancer (HCC)    SKIN CANCER-BASAL CELL  . GERD (gastroesophageal reflux disease)   . Hemorrhoids   . History of colon polyps   . History of kidney stones   . Hyperlipidemia   . Interstitial cystitis    RECEIVES BLADDER INJECTIONS EVERY 6 MONTHS  . Interstitial cystitis   . Prostatitis     Past Surgical History:  Procedure Laterality Date  . ESOPHAGOGASTRODUODENOSCOPY (EGD) WITH PROPOFOL N/A 02/22/2016   Procedure: ESOPHAGOGASTRODUODENOSCOPY (EGD) WITH PROPOFOL;  Surgeon: Manya Silvas, MD;  Location: [facility] ENDOSCOPY;  Service: Endoscopy;  Laterality: N/A;  . GREEN LIGHT LASER TURP (TRANSURETHRAL RESECTION OF PROSTATE N/A 05/19/2017   Procedure: GREEN LIGHT LASER TURP (TRANSURETHRAL RESECTION OF PROSTATE;  Surgeon: Royston Cowper, MD;  Location: [facility] ORS;  Service: Urology;  Laterality: N/A;  . HEMORRHOID BANDING    . HERNIA REPAIR    . LITHOTRIPSY    . TRANSURETHRAL RESECTION OF PROSTATE  1998    There were no vitals filed for this visit.  Subjective Assessment - 02/06/18 1454    Subjective   Patient reports he has some burning sensation in his hands and feet, reports he had testing and does not believe  he has neuropathy however MD referral indicates carpal tunnel and neuropathy.      Pertinent History  Patient reports since October of 2018 he has had a worsening of symptoms of burning in BUE and lower extremities, he reports with hot water, anxiety and times of increased stress, he gets more of a burning sensation to his hands.  He reports he wakes up at night with pain in his wrists and was referred to therapy     Patient Stated Goals  Patient reports he would like to get rid of the burning and tingling in hands, be independent    Currently in Pain?  Yes    Pain Score  4     Pain Location  Hand    Pain Orientation  Left;Right    Pain Descriptors / Indicators  Aching;Burning;Tingling;Numbness    Pain Type  Chronic pain    Pain Onset  More than a month ago    Pain Frequency  Intermittent        [facility] OT Assessment - 02/06/18 1500      Assessment   Medical Diagnosis  CTS    Referring Provider  Rudene Christians    Onset Date/Surgical Date  05/18/17    Hand Dominance  Right      Balance Screen   Has the patient fallen in the past 6  months  No    Has the patient had a decrease in activity level because of a fear of falling?   No    Is the patient reluctant to leave their home because of a fear of falling?   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    Leisure  builds antique cars      ADL   Eating/Feeding  Modified independent    Grooming  Modified independent     Upper Body Bathing  Modified independent    Upper Body Dressing  Increased time;Needs assist for fasteners    Lower Body Dressing  Increased time;Needs assist for fasteners    Toilet Transfer  Modified independent    Tub/Shower Transfer  Modified independent    ADL comments  Patient reports independence with basic self care, he occasionally has difficulty with opening jars and containers, managing small buttons       IADL   Prior Level of Function Shopping  independent    Shopping  Takes care of all shopping needs independently    Prior Level of Function Light Housekeeping  independent    Light Housekeeping  Performs light daily tasks such as dishwashing, bed making    Prior Level of Function Meal Prep  independent    Meal Prep  Able to complete simple warm meal prep    Community Mobility  Drives own vehicle    Prior Level of Function Medication Managment  independent    Medication Management  Is responsible for taking medication in correct dosages at correct time    Prior Level of Function Financial Management  independent    Psychiatrist financial matters independently (budgets, writes checks, pays rent, bills goes to bank), collects and keeps track of income      Written Expression   Dominant Hand  Right      Sensation   Light Touch  Appears Intact    Stereognosis  Appears Intact    Hot/Cold  Appears Intact    Proprioception  Appears Intact    Additional Comments  Patient reports hot water causes increased burning sensation in hands.      Coordination   Gross Motor Movements are Fluid and Coordinated  Yes    Fine Motor Movements are Fluid and Coordinated  Yes      ROM / Strength   AROM / PROM / Strength  AROM;Strength      AROM   Overall AROM   Within functional limits for tasks performed    Overall AROM Comments  BUE ROM WFLs, R shoulder limited ROM from bursitis      Strength   Overall Strength  Within functional limits for tasks performed    Overall  Strength Comments  4/5 overall strength in LUE,  3-/5 overall R shoulder, decreased ability to perform gripping patterns.  full fisting present, full opposition to each digit.        Patient was negative for tinel's sign this date Patient instructed on contrast bath for edema control and to decrease pain Patient performed contrast in the clinic for 15 minutes for 4 cycles of warm 3 mins, cold 1 min without difficulty AROM following contrast Patient was fitted for D ring wrist braces bilaterally, size large.  Instructed on donning and doffing, patient to wear at night while sleeping. Patient instructed on positioning of hands and not to lean head on back of head which he reports he does frequently pushing wrist into flexion.              OT Education - 02/06/18 1506    Education Details  contrast, splints, positioning     Person(s) Educated  Patient    Methods  Explanation;Demonstration    Comprehension  Verbalized understanding;Returned demonstration       OT Short Term Goals - 02/06/18 1519      OT SHORT TERM GOAL #1   Title  Patient will be independent with bilateral splint wear at night for positioning.     Baseline  no splints prior to eval    Time  1    Period  Weeks    Status  New    Target Date  02/11/18      OT SHORT TERM GOAL #2   Title  Patient will demonstrate understanding of contrast bath for home program to decrease edema and pain.    Baseline  no prior knowledge    Time  2    Period  Weeks    Status  New    Target Date  02/19/18        OT Long Term Goals - 02/06/18 1521      OT LONG TERM GOAL #1   Title  Patient will be independent in home exercise program.    Baseline  no current program    Time  3    Period  Weeks    Status  New    Target Date  02/26/18      OT LONG TERM GOAL #2   Title  Patient will improve strength in bilateral hands to be able to manage buttons, opening jars and containers with modified techniques.     Baseline   difficulty at eval with these tasks    Time  3    Period  Weeks    Status  New    Target Date  02/26/18      OT LONG TERM GOAL #3   Title  Patient will decrease pain in bilateral hands to 2/10 or less to participate in daily tasks.     Baseline  pain at eval 4/10    Time  3    Period  Weeks    Status  New    Target Date  02/26/18            Plan - 02/06/18 1507    Clinical Impression Statement  Patient is a 77 yo male who was referred to OT for evaluation and treatment of bilateral UE carpal tunnel syndrome and neuropathy.  Patient complains of pain at night, waking him from sleep, occasional pain during the day and has burning and tingling in the hands most of the time.  He reports increased difficulty with holding objects, gripping and pain with use.  He presents  with pain in bilateral wrists, muscle weakness impacting performance with daily activities at home and work.  He would benefit from skilled OT to maximize safety and independence in daily tasks.     Occupational Profile and client history currently impacting functional performance  pain in bilateral wrist especially at night, neuropathy    Occupational performance deficits (Please refer to evaluation for details):  ADL's;IADL's;Rest and Sleep;Leisure;Work;Social Participation    Rehab Potential  Good    OT Frequency  2x / week    OT Duration  4 weeks    OT Treatment/Interventions  Therapeutic exercise;Moist Heat;Paraffin;Neuromuscular education;Splinting;Patient/family education;Fluidtherapy;Energy conservation;Therapeutic activities;Cryotherapy;Contrast Bath;DME and/or AE instruction;Manual Therapy;Passive range of motion    Plan  2 times a week for 3 weeks per MD order    Clinical Decision Making  Several treatment options, min-mod task modification necessary    Consulted and Agree with Plan of Care  Patient       Patient will benefit from skilled therapeutic intervention in order to improve the following deficits and  impairments:  Increased edema, Impaired flexibility, Pain, Decreased coordination, Impaired sensation, Decreased range of motion, Decreased strength, Impaired UE functional use  Visit Diagnosis: Pain in right wrist  Pain in left wrist  Muscle weakness (generalized)  Carpal tunnel syndrome, bilateral upper limbs    Problem List Patient Active Problem List   Diagnosis Date Noted  . Discomfort in chest 07/06/2012  . Hyperlipidemia 07/06/2012  . Hypertension 07/06/2012  . Right bundle branch block 07/06/2012   Amy T Lovett, OTR/L, CLT  Lovett,Amy 02/06/2018, 3:26 PM  [facility] [address], [place], [address] Phone: [phone]   Fax:  [phone]  Name: Reginald Cobb MRN: [id number] Date of Birth: 15-Apr-1941

## 2018-02-06 NOTE — Therapy (Signed)
[facility] [address] [place], [address] Phone: [phone]   Fax:  [phone]  Occupational Therapy Treatment  Patient Details  Name: Reginald Cobb MRN: [id number] Date of Birth: July 02, 1941 Referring Provider: Rudene Christians   Encounter Date: 02/04/2018  OT End of Session - 02/06/18 1539    Visit Number  2    Number of Visits  6    Date for OT Re-Evaluation  02/26/18    OT Start Time  1600    OT Stop Time  1646    OT Time Calculation (min)  46 min    Activity Tolerance  Patient tolerated treatment well    Behavior During Therapy  [facility] for tasks assessed/performed       Past Medical History:  Diagnosis Date  . Arthritis   . BPH (benign prostatic hyperplasia)   . Bulging lumbar disc   . Cancer (HCC)    SKIN CANCER-BASAL CELL  . GERD (gastroesophageal reflux disease)   . Hemorrhoids   . History of colon polyps   . History of kidney stones   . Hyperlipidemia   . Interstitial cystitis    RECEIVES BLADDER INJECTIONS EVERY 6 MONTHS  . Interstitial cystitis   . Prostatitis     Past Surgical History:  Procedure Laterality Date  . ESOPHAGOGASTRODUODENOSCOPY (EGD) WITH PROPOFOL N/A 02/22/2016   Procedure: ESOPHAGOGASTRODUODENOSCOPY (EGD) WITH PROPOFOL;  Surgeon: Manya Silvas, MD;  Location: [facility] ENDOSCOPY;  Service: Endoscopy;  Laterality: N/A;  . GREEN LIGHT LASER TURP (TRANSURETHRAL RESECTION OF PROSTATE N/A 05/19/2017   Procedure: GREEN LIGHT LASER TURP (TRANSURETHRAL RESECTION OF PROSTATE;  Surgeon: Royston Cowper, MD;  Location: [facility] ORS;  Service: Urology;  Laterality: N/A;  . HEMORRHOID BANDING    . HERNIA REPAIR    . LITHOTRIPSY    . TRANSURETHRAL RESECTION OF PROSTATE  1998    There were no vitals filed for this visit.  Subjective Assessment - 02/06/18 1535    Subjective   Patient reports he is feeling good today, decreased pain and has slept better the last couple days.  The first  night with splints he had some ulnar pain, discussed not pulling strap too tight on that side.  He adjusted the strap and was fine.      Pertinent History  Patient reports since October of 2018 he has had a worsening of symptoms of burning in BUE and lower extremities, he reports with hot water, anxiety and times of increased stress, he gets more of a burning sensation to his hands.  He reports he wakes up at night with pain in his wrists and was referred to therapy     Patient Stated Goals  Patient reports he would like to get rid of the burning and tingling in hands, be independent    Currently in Pain?  Yes    Pain Score  3     Pain Orientation  Right;Left    Pain Descriptors / Indicators  Aching;Burning;Numbness    Pain Type  Chronic pain    Pain Onset  More than a month ago    Pain Frequency  Intermittent         [facility] OT Assessment - 02/06/18 1500      Assessment   Medical Diagnosis  CTS    Referring Provider  Rudene Christians    Onset Date/Surgical Date  05/18/17    Hand Dominance  Right      Balance Screen  Has the patient fallen in the past 6 months  No    Has the patient had a decrease in activity level because of a fear of falling?   No    Is the patient reluctant to leave their home because of a fear of falling?   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    Leisure  builds antique cars      ADL   Eating/Feeding  Modified independent    Grooming   Modified independent    Upper Body Bathing  Modified independent    Upper Body Dressing  Increased time;Needs assist for fasteners    Lower Body Dressing  Increased time;Needs assist for fasteners    Toilet Transfer  Modified independent    Tub/Shower Transfer  Modified independent    ADL comments  Patient reports independence with basic self care, he occasionally has difficulty with opening jars and containers, managing small buttons       IADL   Prior Level of Function Shopping  independent    Shopping  Takes care of all shopping needs independently    Prior Level of Function Light Housekeeping  independent    Light Housekeeping  Performs light daily tasks such as dishwashing, bed making    Prior Level of Function Meal Prep  independent    Meal Prep  Able to complete simple warm meal prep    Community Mobility  Drives own vehicle    Prior Level of Function Medication Managment  independent    Medication Management  Is responsible for taking medication in correct dosages at correct time    Prior Level of Function Financial Management  independent    Psychiatrist financial matters independently (budgets, writes checks, pays rent, bills goes to bank), collects and keeps track of income      Written Expression   Dominant Hand  Right      Sensation   Light Touch  Appears Intact    Stereognosis  Appears Intact    Hot/Cold  Appears Intact    Proprioception  Appears Intact    Additional Comments  Patient reports hot water causes increased burning sensation in hands.      Coordination   Gross Motor Movements are Fluid and Coordinated  Yes    Fine Motor Movements are Fluid and Coordinated  Yes      ROM / Strength   AROM / PROM / Strength  AROM;Strength      AROM   Overall AROM   Within functional limits for tasks performed    Overall AROM Comments  BUE ROM WFLs, R shoulder limited ROM from bursitis      Strength   Overall Strength  Within functional limits for  tasks performed    Overall  Strength Comments  4/5 overall strength in LUE, 3-/5 overall R shoulder, decreased ability to perform gripping patterns.  full fisting present, full opposition to each digit.       Patient seen for OT treatment as follows: Contrast 15 minutes for 4 cycles, 3 mins warm, 1 minute cold to decrease edema and decrease pain Patient seen for tendon gliding exercises with handout issued, performed 10 reps for each exercise Instructed on stretching exercises for wrist flexion/extension for 10 reps with cues and therapist demo  ADLs-patient instructed on joint protection principles at home, positioning of wrists and activities to avoid, will issue additional written information next session.          OT Treatments/Exercises (OP) - 02/06/18 0001      RUE Contrast Bath   Time  15 minutes      LUE Contrast Bath   Time  15 minutes             OT Education - 02/06/18 1539    Education Details  HEP, splint wear and strap adjustment    Person(s) Educated  Patient    Methods  Explanation;Demonstration    Comprehension  Verbalized understanding;Returned demonstration       OT Short Term Goals - 02/06/18 1519      OT SHORT TERM GOAL #1   Title  Patient will be independent with bilateral splint wear at night for positioning.     Baseline  no splints prior to eval    Time  1    Period  Weeks    Status  New    Target Date  02/11/18      OT SHORT TERM GOAL #2   Title  Patient will demonstrate understanding of contrast bath for home program to decrease edema and pain.    Baseline  no prior knowledge    Time  2    Period  Weeks    Status  New    Target Date  02/19/18        OT Long Term Goals - 02/06/18 1521      OT LONG TERM GOAL #1   Title  Patient will be independent in home exercise program.    Baseline  no current program    Time  3    Period  Weeks    Status  New    Target Date  02/26/18      OT LONG TERM GOAL #2   Title  Patient will  improve strength in bilateral hands to be able to manage buttons, opening jars and containers with modified techniques.     Baseline  difficulty at eval with these tasks    Time  3    Period  Weeks    Status  New    Target Date  02/26/18      OT LONG TERM GOAL #3   Title  Patient will decrease pain in bilateral hands to 2/10 or less to participate in daily tasks.     Baseline  pain at eval 4/10    Time  3    Period  Weeks    Status  New    Target Date  02/26/18            Plan - 02/06/18 1540    Clinical Impression Statement  Patient with decreased pain today and reports hands are feeling better after implementing contrast baths and wearing bilateral wrist splints at night.  He requires cues for ROM exercises in the  clinic, provided with pictures of exercises this date.  Continue to work towards decrease pain, increase motion and strength for daily tasks.     Occupational Profile and client history currently impacting functional performance  pain in bilateral wrist especially at night, neuropathy    Occupational performance deficits (Please refer to evaluation for details):  ADL's;IADL's;Rest and Sleep;Leisure;Work;Social Participation    Rehab Potential  Good    OT Frequency  2x / week    OT Duration  4 weeks    OT Treatment/Interventions  Therapeutic exercise;Moist Heat;Paraffin;Neuromuscular education;Splinting;Patient/family education;Fluidtherapy;Energy conservation;Therapeutic activities;Cryotherapy;Contrast Bath;DME and/or AE instruction;Manual Therapy;Passive range of motion    Plan  2 times a week for 3 weeks per MD order    Consulted and Agree with Plan of Care  Patient       Patient will benefit from skilled therapeutic intervention in order to improve the following deficits and impairments:  Increased edema, Impaired flexibility, Pain, Decreased coordination, Impaired sensation, Decreased range of motion, Decreased strength, Impaired UE functional use  Visit  Diagnosis: Pain in right wrist  Pain in left wrist  Muscle weakness (generalized)  Carpal tunnel syndrome, bilateral upper limbs    Problem List Patient Active Problem List   Diagnosis Date Noted  . Discomfort in chest 07/06/2012  . Hyperlipidemia 07/06/2012  . Hypertension 07/06/2012  . Right bundle branch block 07/06/2012   Geraldyn Shain T Marvetta Vohs, OTR/L, CLT  Kester Stimpson 02/06/2018, 3:42 PM  [facility] [address] [place], [address] Phone: [phone]   Fax:  [phone]  Name: Reginald Cobb MRN: [id number] Date of Birth: 12/26/1940

## 2018-02-10 ENCOUNTER — Ambulatory Visit: Payer: PPO | Admitting: Occupational Therapy

## 2018-02-10 DIAGNOSIS — M25532 Pain in left wrist: Secondary | ICD-10-CM

## 2018-02-10 DIAGNOSIS — M25531 Pain in right wrist: Secondary | ICD-10-CM

## 2018-02-10 DIAGNOSIS — M6281 Muscle weakness (generalized): Secondary | ICD-10-CM

## 2018-02-10 DIAGNOSIS — G5603 Carpal tunnel syndrome, bilateral upper limbs: Secondary | ICD-10-CM

## 2018-02-11 ENCOUNTER — Encounter: Payer: Self-pay | Admitting: Occupational Therapy

## 2018-02-11 NOTE — Therapy (Signed)
[facility] [address] [place], [address] Phone: [phone]   Fax:  [phone]  Occupational Therapy Treatment  Patient Details  Name: Reginald Cobb MRN: [id number] Date of Birth: July 27, 1941 Referring Provider: Rudene Christians   Encounter Date: 02/10/2018  OT End of Session - 02/11/18 1939    Visit Number  3    Number of Visits  6    Date for OT Re-Evaluation  02/26/18    OT Start Time  0915    OT Stop Time  1000    OT Time Calculation (min)  45 min    Activity Tolerance  Patient tolerated treatment well    Behavior During Therapy  [facility] for tasks assessed/performed       Past Medical History:  Diagnosis Date  . Arthritis   . BPH (benign prostatic hyperplasia)   . Bulging lumbar disc   . Cancer (HCC)    SKIN CANCER-BASAL CELL  . GERD (gastroesophageal reflux disease)   . Hemorrhoids   . History of colon polyps   . History of kidney stones   . Hyperlipidemia   . Interstitial cystitis    RECEIVES BLADDER INJECTIONS EVERY 6 MONTHS  . Interstitial cystitis   . Prostatitis     Past Surgical History:  Procedure Laterality Date  . ESOPHAGOGASTRODUODENOSCOPY (EGD) WITH PROPOFOL N/A 02/22/2016   Procedure: ESOPHAGOGASTRODUODENOSCOPY (EGD) WITH PROPOFOL;  Surgeon: Manya Silvas, MD;  Location: [facility] ENDOSCOPY;  Service: Endoscopy;  Laterality: N/A;  . GREEN LIGHT LASER TURP (TRANSURETHRAL RESECTION OF PROSTATE N/A 05/19/2017   Procedure: GREEN LIGHT LASER TURP (TRANSURETHRAL RESECTION OF PROSTATE;  Surgeon: Royston Cowper, MD;  Location: [facility] ORS;  Service: Urology;  Laterality: N/A;  . HEMORRHOID BANDING    . HERNIA REPAIR    . LITHOTRIPSY    . TRANSURETHRAL RESECTION OF PROSTATE  1998    There were no vitals filed for this visit.  Subjective Assessment - 02/11/18 1938    Pertinent History  Patient reports since October of 2018 he has had a worsening of symptoms of burning in BUE and lower  extremities, he reports with hot water, anxiety and times of increased stress, he gets more of a burning sensation to his hands.  He reports he wakes up at night with pain in his wrists and was referred to therapy     Patient Stated Goals  Patient reports he would like to get rid of the burning and tingling in hands, be independent    Currently in Pain?  Yes    Pain Score  2     Pain Orientation  Right;Left    Pain Descriptors / Indicators  Aching;Burning;Numbness    Pain Onset  More than a month ago    Pain Frequency  Intermittent    Multiple Pain Sites  No          Patient seen for OT treatment as follows: Contrast 11 minutes for 3 cycles, 3 mins warm, 1 minute cold to decrease edema and decrease pain Patient seen for tendon gliding exercises with handout issued, performed 10 reps for each exercise Instructed on stretching exercises for wrist flexion/extension for 10 reps with cues and therapist demo, added prayer stretch   Issued written joint protection principles and educated on each with examples and problem solving and suggestions for current  Needs at home and at work.  Instructed on principles in the kitchen and modifications for tools.  OT Treatments/Exercises (OP) - 02/11/18 1941      RUE Contrast Bath   Time  11 minutes      LUE Contrast Bath   Time  11 minutes             OT Education - 02/11/18 1938    Education Details  joint protection principles, ice massage    Person(s) Educated  Patient    Methods  Explanation;Demonstration    Comprehension  Verbalized understanding;Returned demonstration       OT Short Term Goals - 02/06/18 1519      OT SHORT TERM GOAL #1   Title  Patient will be independent with bilateral splint wear at night for positioning.     Baseline  no splints prior to eval    Time  1    Period  Weeks    Status  New    Target Date  02/11/18      OT SHORT TERM GOAL #2   Title  Patient will demonstrate  understanding of contrast bath for home program to decrease edema and pain.    Baseline  no prior knowledge    Time  2    Period  Weeks    Status  New    Target Date  02/19/18        OT Long Term Goals - 02/06/18 1521      OT LONG TERM GOAL #1   Title  Patient will be independent in home exercise program.    Baseline  no current program    Time  3    Period  Weeks    Status  New    Target Date  02/26/18      OT LONG TERM GOAL #2   Title  Patient will improve strength in bilateral hands to be able to manage buttons, opening jars and containers with modified techniques.     Baseline  difficulty at eval with these tasks    Time  3    Period  Weeks    Status  New    Target Date  02/26/18      OT LONG TERM GOAL #3   Title  Patient will decrease pain in bilateral hands to 2/10 or less to participate in daily tasks.     Baseline  pain at eval 4/10    Time  3    Period  Weeks    Status  New    Target Date  02/26/18            Plan - 02/11/18 1939    Clinical Impression Statement  Patient has continued to progress, decreased pain and increased ROM in bilateral UEs.  He requires occasional cues for exercises, written/pictorial handouts issued this date.  Prayer stretch added.  Instructed on joint protection principles and issued handout as well as instruction on kitchen tasks and modifications.  Continue to work towards goals to increase independence in daily tasks, decrease pain.    Occupational Profile and client history currently impacting functional performance  pain in bilateral wrist especially at night, neuropathy    Occupational performance deficits (Please refer to evaluation for details):  ADL's;IADL's;Rest and Sleep;Leisure;Work;Social Participation    Rehab Potential  Good    OT Frequency  2x / week    OT Duration  4 weeks    OT Treatment/Interventions  Therapeutic exercise;Moist Heat;Paraffin;Neuromuscular education;Splinting;Patient/family  education;Fluidtherapy;Energy conservation;Therapeutic activities;Cryotherapy;Contrast Bath;DME and/or AE instruction;Manual Therapy;Passive range of motion    Consulted and Agree with  Plan of Care  Patient       Patient will benefit from skilled therapeutic intervention in order to improve the following deficits and impairments:  Increased edema, Impaired flexibility, Pain, Decreased coordination, Impaired sensation, Decreased range of motion, Decreased strength, Impaired UE functional use  Visit Diagnosis: Pain in right wrist  Pain in left wrist  Muscle weakness (generalized)  Carpal tunnel syndrome, bilateral upper limbs    Problem List Patient Active Problem List   Diagnosis Date Noted  . Discomfort in chest 07/06/2012  . Hyperlipidemia 07/06/2012  . Hypertension 07/06/2012  . Right bundle branch block 07/06/2012   Amy T Lovett, OTR/L, CLT  Lovett,Amy 02/11/2018, 7:45 PM  [facility] [address], [place], [address] Phone: [phone]   Fax:  [phone]  Name: Reginald Cobb MRN: [id number] Date of Birth: 1941/07/18

## 2018-02-15 ENCOUNTER — Ambulatory Visit: Payer: PPO | Admitting: Occupational Therapy

## 2018-02-23 ENCOUNTER — Ambulatory Visit: Payer: PPO | Admitting: Occupational Therapy

## 2018-02-26 ENCOUNTER — Encounter: Payer: PPO | Admitting: Occupational Therapy

## 2018-04-27 ENCOUNTER — Encounter: Payer: Self-pay | Admitting: *Deleted

## 2018-04-28 ENCOUNTER — Encounter
Admission: RE | Disposition: A | Discharge status: Home / Self Care | Payer: Self-pay | Source: Ambulatory Visit | Attending: Unknown Physician Specialty

## 2018-04-28 ENCOUNTER — Ambulatory Visit: Payer: PPO | Admitting: Certified Registered"

## 2018-04-28 ENCOUNTER — Encounter: Payer: Self-pay | Admitting: Student

## 2018-04-28 ENCOUNTER — Ambulatory Visit
Admission: RE | Admit: 2018-04-28 | Discharge: 2018-04-28 | Disposition: A | Discharge status: Home / Self Care | Payer: PPO | Source: Ambulatory Visit | Attending: Unknown Physician Specialty | Admitting: Unknown Physician Specialty

## 2018-04-28 ENCOUNTER — Other Ambulatory Visit: Payer: Self-pay

## 2018-04-28 DIAGNOSIS — K621 Rectal polyp: Secondary | ICD-10-CM | POA: Diagnosis not present

## 2018-04-28 DIAGNOSIS — K219 Gastro-esophageal reflux disease without esophagitis: Secondary | ICD-10-CM | POA: Diagnosis not present

## 2018-04-28 DIAGNOSIS — E785 Hyperlipidemia, unspecified: Secondary | ICD-10-CM | POA: Diagnosis not present

## 2018-04-28 DIAGNOSIS — D123 Benign neoplasm of transverse colon: Secondary | ICD-10-CM | POA: Insufficient documentation

## 2018-04-28 DIAGNOSIS — Z881 Allergy status to other antibiotic agents status: Secondary | ICD-10-CM | POA: Insufficient documentation

## 2018-04-28 DIAGNOSIS — Z85828 Personal history of other malignant neoplasm of skin: Secondary | ICD-10-CM | POA: Diagnosis not present

## 2018-04-28 DIAGNOSIS — Z1211 Encounter for screening for malignant neoplasm of colon: Secondary | ICD-10-CM | POA: Diagnosis not present

## 2018-04-28 DIAGNOSIS — I10 Essential (primary) hypertension: Secondary | ICD-10-CM | POA: Insufficient documentation

## 2018-04-28 DIAGNOSIS — Z86010 Personal history of colonic polyps: Secondary | ICD-10-CM | POA: Diagnosis not present

## 2018-04-28 DIAGNOSIS — K635 Polyp of colon: Secondary | ICD-10-CM | POA: Diagnosis not present

## 2018-04-28 DIAGNOSIS — M1990 Unspecified osteoarthritis, unspecified site: Secondary | ICD-10-CM | POA: Insufficient documentation

## 2018-04-28 DIAGNOSIS — D122 Benign neoplasm of ascending colon: Secondary | ICD-10-CM | POA: Insufficient documentation

## 2018-04-28 DIAGNOSIS — I4510 Unspecified right bundle-branch block: Secondary | ICD-10-CM | POA: Insufficient documentation

## 2018-04-28 DIAGNOSIS — Z8249 Family history of ischemic heart disease and other diseases of the circulatory system: Secondary | ICD-10-CM | POA: Insufficient documentation

## 2018-04-28 DIAGNOSIS — D128 Benign neoplasm of rectum: Secondary | ICD-10-CM | POA: Diagnosis not present

## 2018-04-28 DIAGNOSIS — Z79899 Other long term (current) drug therapy: Secondary | ICD-10-CM | POA: Insufficient documentation

## 2018-04-28 DIAGNOSIS — K648 Other hemorrhoids: Secondary | ICD-10-CM | POA: Diagnosis not present

## 2018-04-28 DIAGNOSIS — N400 Enlarged prostate without lower urinary tract symptoms: Secondary | ICD-10-CM | POA: Insufficient documentation

## 2018-04-28 HISTORY — DX: Cardiac arrhythmia, unspecified: I49.9

## 2018-04-28 HISTORY — PX: COLONOSCOPY WITH PROPOFOL: SHX5780

## 2018-04-28 HISTORY — DX: Personal history of other diseases of the digestive system: Z87.19

## 2018-04-28 HISTORY — DX: Bursitis of unspecified shoulder: M75.50

## 2018-04-28 HISTORY — DX: Essential (primary) hypertension: I10

## 2018-04-28 SURGERY — COLONOSCOPY WITH PROPOFOL
Anesthesia: General

## 2018-04-28 MED ORDER — MIDAZOLAM HCL 5 MG/5ML IJ SOLN
INTRAMUSCULAR | Status: DC | PRN
  Administered 2018-04-28: 2 mg via INTRAVENOUS

## 2018-04-28 MED ORDER — LIDOCAINE HCL (PF) 1 % IJ SOLN
INTRAMUSCULAR | Status: AC
  Filled 2018-04-28: qty 2

## 2018-04-28 MED ORDER — SODIUM CHLORIDE 0.9 % IV SOLN
INTRAVENOUS | Status: DC
  Administered 2018-04-28: 10:00:00 via INTRAVENOUS

## 2018-04-28 MED ORDER — SODIUM CHLORIDE 0.9 % IV SOLN: INTRAVENOUS | Status: DC

## 2018-04-28 MED ORDER — MIDAZOLAM HCL 2 MG/2ML IJ SOLN
INTRAMUSCULAR | Status: AC
  Filled 2018-04-28: qty 2

## 2018-04-28 MED ORDER — PROPOFOL 10 MG/ML IV BOLUS
INTRAVENOUS | Status: DC | PRN
  Administered 2018-04-28: 70 mg via INTRAVENOUS
  Administered 2018-04-28: 30 mg via INTRAVENOUS

## 2018-04-28 MED ORDER — PROPOFOL 500 MG/50ML IV EMUL
INTRAVENOUS | Status: DC | PRN
  Administered 2018-04-28: 120 ug/kg/min via INTRAVENOUS

## 2018-04-28 MED ORDER — LIDOCAINE 2% (20 MG/ML) 5 ML SYRINGE
INTRAMUSCULAR | Status: DC | PRN
  Administered 2018-04-28: 25 mg via INTRAVENOUS

## 2018-04-28 NOTE — Anesthesia Preprocedure Evaluation (Signed)
Anesthesia Evaluation  Patient identified by MRN, date of birth, ID band Patient awake    Reviewed: Allergy & Precautions, H&P , NPO status , Patient's Chart, lab work & pertinent test results  History of Anesthesia Complications Negative for: history of anesthetic complications  Airway Mallampati: III  TM Distance: <3 FB Neck ROM: limited    Dental  (+) Chipped   Pulmonary neg shortness of breath,           Cardiovascular Exercise Tolerance: Good hypertension, (-) angina(-) Past MI and (-) DOE + dysrhythmias      Neuro/Psych negative neurological ROS  negative psych ROS   GI/Hepatic negative GI ROS, Neg liver ROS, GERD  Medicated and Controlled,  Endo/Other  negative endocrine ROS  Renal/GU negative Renal ROS  negative genitourinary   Musculoskeletal  (+) Arthritis ,   Abdominal   Peds  Hematology negative hematology ROS (+)   Anesthesia Other Findings Past Medical History: No date: Arthritis No date: BPH (benign prostatic hyperplasia) No date: Bulging lumbar disc No date: Bursitis of shoulder No date: Cancer (HCC)     Comment:  SKIN CANCER-BASAL CELL No date: Dysrhythmia     Comment:  right bundle branch block No date: GERD (gastroesophageal reflux disease) No date: Hemorrhoids No date: History of colon polyps No date: History of hemorrhoids No date: History of kidney stones No date: Hyperlipidemia No date: Hypertension No date: Interstitial cystitis     Comment:  RECEIVES BLADDER INJECTIONS EVERY 6 MONTHS No date: Interstitial cystitis No date: Prostatitis  Past Surgical History: No date: colonoscopy with polypectomy 02/22/2016: ESOPHAGOGASTRODUODENOSCOPY (EGD) WITH PROPOFOL; N/A     Comment:  Procedure: ESOPHAGOGASTRODUODENOSCOPY (EGD) WITH               PROPOFOL;  Surgeon: Manya Silvas, MD;  Location: [facility]              ENDOSCOPY;  Service: Endoscopy;  Laterality: N/A; 05/19/2017: GREEN LIGHT  LASER TURP (TRANSURETHRAL RESECTION OF  PROSTATE; N/A     Comment:  Procedure: GREEN LIGHT LASER TURP (TRANSURETHRAL               RESECTION OF PROSTATE;  Surgeon: Royston Cowper, MD;                Location: [facility] ORS;  Service: Urology;  Laterality: N/A; No date: HEMORRHOID BANDING No date: HERNIA REPAIR No date: LITHOTRIPSY 1998: TRANSURETHRAL RESECTION OF PROSTATE  BMI    Body Mass Index:  26.45 kg/m      Reproductive/Obstetrics negative OB ROS                             Anesthesia Physical Anesthesia Plan  ASA: III  Anesthesia Plan: General   Post-op Pain Management:    Induction: Intravenous  PONV Risk Score and Plan: Propofol infusion and TIVA  Airway Management Planned: Natural Airway and Nasal Cannula  Additional Equipment:   Intra-op Plan:   Post-operative Plan:   Informed Consent: I have reviewed the patients History and Physical, chart, labs and discussed the procedure including the risks, benefits and alternatives for the proposed anesthesia with the patient or authorized representative who has indicated his/her understanding and acceptance.   Dental Advisory Given  Plan Discussed with: Anesthesiologist, CRNA and Surgeon  Anesthesia Plan Comments: (Patient consented for risks of anesthesia including but not limited to:  - adverse reactions to medications - risk of intubation if required -  damage to teeth, lips or other oral mucosa - sore throat or hoarseness - Damage to heart, brain, lungs or loss of life  Patient voiced understanding.)        Anesthesia Quick Evaluation  

## 2018-04-28 NOTE — Anesthesia Post-op Follow-up Note (Signed)
Anesthesia QCDR form completed.        

## 2018-04-28 NOTE — H&P (Signed)
Primary Care Physician:  Albina Billet, MD Primary Gastroenterologist:  Dr. Vira Agar  Pre-Procedure History & Physical: HPI:  Reginald Cobb is a 77 y.o. male is here for an colonoscopy.  Done for personal history of colon polyps.   Past Medical History:  Diagnosis Date  . Arthritis   . BPH (benign prostatic hyperplasia)   . Bulging lumbar disc   . Bursitis of shoulder   . Cancer (HCC)    SKIN CANCER-BASAL CELL  . Dysrhythmia    right bundle branch block  . GERD (gastroesophageal reflux disease)   . Hemorrhoids   . History of colon polyps   . History of hemorrhoids   . History of kidney stones   . Hyperlipidemia   . Hypertension   . Interstitial cystitis    RECEIVES BLADDER INJECTIONS EVERY 6 MONTHS  . Interstitial cystitis   . Prostatitis     Past Surgical History:  Procedure Laterality Date  . colonoscopy with polypectomy    . ESOPHAGOGASTRODUODENOSCOPY (EGD) WITH PROPOFOL N/A 02/22/2016   Procedure: ESOPHAGOGASTRODUODENOSCOPY (EGD) WITH PROPOFOL;  Surgeon: Manya Silvas, MD;  Location: [facility] ENDOSCOPY;  Service: Endoscopy;  Laterality: N/A;  . GREEN LIGHT LASER TURP (TRANSURETHRAL RESECTION OF PROSTATE N/A 05/19/2017   Procedure: GREEN LIGHT LASER TURP (TRANSURETHRAL RESECTION OF PROSTATE;  Surgeon: Royston Cowper, MD;  Location: [facility] ORS;  Service: Urology;  Laterality: N/A;  . HEMORRHOID BANDING    . HERNIA REPAIR    . LITHOTRIPSY    . TRANSURETHRAL RESECTION OF PROSTATE  1998    Prior to Admission medications   Medication Sig Start Date End Date Taking? Authorizing Provider  acetaminophen (TYLENOL) 650 MG CR tablet Take 325 mg by mouth 2 (two) times daily.   Yes [provider]  Menthol, Topical Analgesic, (BLUE-EMU MAXIMUM STRENGTH EX) Apply 1 application topically daily as needed (back pain).   Yes [provider]  omega-3 acid ethyl esters (LOVAZA) 1 g capsule Take 2 g by mouth 2 (two) times daily. Omega XL   Yes [provider]  omeprazole (PRILOSEC) 40 MG capsule Take 40 mg by mouth every morning. Reported on 02/22/2016   Yes [provider]  Probiotic Product (PROBIOTIC DAILY PO) Take 2 tablets by mouth daily.    Yes [provider]  atorvastatin (LIPITOR) 20 MG tablet Take 20 mg by mouth every other day. Alternates with Red Yeast Rice    [provider]  Coenzyme Q10 (COQ-10 PO) Take 1 tablet by mouth daily.    [provider]  docusate sodium (COLACE) 100 MG capsule Take 100 mg by mouth 2 (two) times daily.    [provider]  gabapentin (NEURONTIN) 100 MG capsule Take 100 mg by mouth 2 (two) times daily.    [provider]  hydrocortisone (ANUSOL-HC) 25 MG suppository Place 25 mg rectally 2 (two) times daily as needed.    [provider]  Hyoscyamine Sulfate 0.375 MG CP12 Take 0.375 mg by mouth daily as needed (stomach pain).     [provider]  Meth-Hyo-M Bl-Na Phos-Ph Sal (URIBEL PO) Take 1 capsule by mouth daily as needed.     [provider]  Red Yeast Rice 600 MG CAPS Take 1 capsule by mouth every other day. Alternates with Atorvastatin    [provider]  sulfamethoxazole-trimethoprim (BACTRIM DS,SEPTRA DS) 800-160 MG tablet Take 1 tablet by mouth 2 (two) times daily. Patient not taking: Reported on 04/28/2018 05/19/17   Yves Dill,  Otelia Limes, MD    Allergies as of 02/10/2018 - Review Complete 02/06/2018  Allergen Reaction Noted  . Levaquin [levofloxacin] Other (See Comments) 05/08/2017    Family History  Problem Relation Age of Onset  . Heart disease Mother   . Hyperlipidemia Father   . Heart failure Father   . Heart disease Brother   . Heart failure Brother     Social History   Socioeconomic History  . Marital status: Married    Spouse name: Not on file  . Number of children: Not on file  . Years of education: Not on file  . Highest education level: Not on file  Occupational History  . Not on  file  Social Needs  . Financial resource strain: Not on file  . Food insecurity:    Worry: Not on file    Inability: Not on file  . Transportation needs:    Medical: Not on file    Non-medical: Not on file  Tobacco Use  . Smoking status: Never Smoker  . Smokeless tobacco: Never Used  Substance and Sexual Activity  . Alcohol use: No  . Drug use: No  . Sexual activity: Not on file  Lifestyle  . Physical activity:    Days per week: Not on file    Minutes per session: Not on file  . Stress: Not on file  Relationships  . Social connections:    Talks on phone: Not on file    Gets together: Not on file    Attends religious service: Not on file    Active member of club or organization: Not on file    Attends meetings of clubs or organizations: Not on file    Relationship status: Not on file  . Intimate partner violence:    Fear of current or ex partner: Not on file    Emotionally abused: Not on file    Physically abused: Not on file    Forced sexual activity: Not on file  Other Topics Concern  . Not on file  Social History Narrative  . Not on file    Review of Systems: See HPI, otherwise negative ROS  Physical Exam: BP (!) 145/89   Pulse 86   Temp (!) 96 F (35.6 C) (Tympanic)   Resp 16   Ht 6' (1.829 m)   Wt 88.5 kg   SpO2 100%   BMI 26.45 kg/m  General:   Alert,  pleasant and cooperative in NAD Head:  Normocephalic and atraumatic. Neck:  Supple; no masses or thyromegaly. Lungs:  Clear throughout to auscultation.    Heart:  Regular rate and rhythm. Abdomen:  Soft, nontender and nondistended. Normal bowel sounds, without guarding, and without rebound.   Neurologic:  Alert and  oriented x4;  grossly normal neurologically.  Impression/Plan: Reginald Cobb is here for an colonoscopy to be performed for personal history of colon polyps.  Risks, benefits, limitations, and alternatives regarding  colonoscopy have been reviewed with the patient.  Questions  have been answered.  All parties agreeable.   Gaylyn Cheers, MD  04/28/2018, 9:40 AM

## 2018-04-28 NOTE — Op Note (Signed)
[facility] Patient Name: Reginald Cobb Procedure Date: 04/28/2018 9:42 AM MRN: [id number] Account #: [id number] Date of Birth: [birth year] Admit Type: Outpatient Age: 77 Room: [facility] ENDO ROOM 3 Gender: Male Note Status: Finalized Procedure:            Colonoscopy Indications:          High risk colon cancer surveillance: Personal history                        of colonic polyps Providers:            Manya Silvas, MD Referring MD:         Leona Carry. Hall Busing, MD (Referring MD) Medicines:            Propofol per Anesthesia Complications:        No immediate complications. Procedure:            Pre-Anesthesia Assessment:                       - After reviewing the risks and benefits, the patient                        was deemed in satisfactory condition to undergo the                        procedure.                       After obtaining informed consent, the colonoscope was                        passed under direct vision. Throughout the procedure,                        the patient's blood pressure, pulse, and oxygen                        saturations were monitored continuously. The                        Colonoscope was introduced through the anus and                        advanced to the the cecum, identified by appendiceal                        orifice and ileocecal valve. The colonoscopy was                        performed without difficulty. The patient tolerated the                        procedure well. The quality of the bowel preparation                        was excellent. Findings:      A small polyp was found in the ascending colon. The polyp was sessile.       The polyp was removed with a hot snare. Resection and retrieval were       complete.      A diminutive polyp was found  in the hepatic flexure. The polyp was       sessile. Biopsies were taken with a cold forceps for histology.      A 6 mm polyp was found in the  splenic flexure. The polyp was sessile.       The polyp was removed with a hot snare. Resection and retrieval were       complete.      Four sessile polyps were found in the rectum. The polyps were diminutive       in size. These polyps were removed with a jumbo cold forceps. Resection       and retrieval were complete.      The exam was otherwise without abnormality. Impression:           - One small polyp in the ascending colon, removed with                        a hot snare. Resected and retrieved.                       - One diminutive polyp at the hepatic flexure. Biopsied.                       - One 6 mm polyp at the splenic flexure, removed with a                        hot snare. Resected and retrieved.                       - Four diminutive polyps in the rectum, removed with a                        jumbo cold forceps. Resected and retrieved.                       - The examination was otherwise normal. Recommendation:       - Await pathology results. Manya Silvas, MD 04/28/2018 10:14:23 AM This report has been signed electronically. Number of Addenda: 0 Note Initiated On: 04/28/2018 9:42 AM Scope Withdrawal Time: 0 hours 15 minutes 28 seconds  Total Procedure Duration: 0 hours 20 minutes 5 seconds       [facility]

## 2018-04-28 NOTE — Transfer of Care (Signed)
Immediate Anesthesia Transfer of Care Note  Patient: Reginald Cobb  Procedure(s) Performed: COLONOSCOPY WITH PROPOFOL (N/A )  Patient Location: Endoscopy Unit  Anesthesia Type:General  Level of Consciousness: sedated  Airway & Oxygen Therapy: Patient Spontanous Breathing and Patient connected to nasal cannula oxygen  Post-op Assessment: Report given to RN and Post -op Vital signs reviewed and stable  Post vital signs: Reviewed  Last Vitals:  Vitals Value Taken Time  BP 106/72 04/28/2018 10:14 AM  Temp 36.1 C 04/28/2018 10:14 AM  Pulse 75 04/28/2018 10:14 AM  Resp 16 04/28/2018 10:14 AM  SpO2 99 % 04/28/2018 10:14 AM    Last Pain:  Vitals:   04/28/18 1014  TempSrc: Tympanic  PainSc:          Complications: No apparent anesthesia complications

## 2018-04-28 NOTE — Anesthesia Postprocedure Evaluation (Signed)
Anesthesia Post Note  Patient: Reginald Cobb  Procedure(s) Performed: COLONOSCOPY WITH PROPOFOL (N/A )  Patient location during evaluation: Endoscopy Anesthesia Type: General Level of consciousness: awake and alert Pain management: pain level controlled Vital Signs Assessment: post-procedure vital signs reviewed and stable Respiratory status: spontaneous breathing, nonlabored ventilation, respiratory function stable and patient connected to nasal cannula oxygen Cardiovascular status: blood pressure returned to baseline and stable Postop Assessment: no apparent nausea or vomiting Anesthetic complications: no     Last Vitals:  Vitals:   04/28/18 1024 04/28/18 1034  BP: 115/78 (!) 131/93  Pulse: 67 72  Resp: 15 16  Temp:    SpO2: 100% 99%    Last Pain:  Vitals:   04/28/18 1034  TempSrc:   PainSc: 0-No pain                 Precious Haws Staci Dack

## 2018-04-29 LAB — SURGICAL PATHOLOGY

## 2018-05-25 DIAGNOSIS — M7551 Bursitis of right shoulder: Secondary | ICD-10-CM | POA: Diagnosis not present

## 2018-05-25 DIAGNOSIS — M25511 Pain in right shoulder: Secondary | ICD-10-CM | POA: Diagnosis not present

## 2018-05-25 DIAGNOSIS — M7541 Impingement syndrome of right shoulder: Secondary | ICD-10-CM | POA: Diagnosis not present

## 2018-05-25 DIAGNOSIS — G8929 Other chronic pain: Secondary | ICD-10-CM | POA: Diagnosis not present

## 2018-05-25 DIAGNOSIS — M19011 Primary osteoarthritis, right shoulder: Secondary | ICD-10-CM | POA: Diagnosis not present

## 2018-06-23 DIAGNOSIS — Z23 Encounter for immunization: Secondary | ICD-10-CM | POA: Diagnosis not present

## 2018-06-23 DIAGNOSIS — E785 Hyperlipidemia, unspecified: Secondary | ICD-10-CM | POA: Diagnosis not present

## 2018-06-29 DIAGNOSIS — E785 Hyperlipidemia, unspecified: Secondary | ICD-10-CM | POA: Diagnosis not present

## 2018-06-29 DIAGNOSIS — M5134 Other intervertebral disc degeneration, thoracic region: Secondary | ICD-10-CM | POA: Diagnosis not present

## 2018-10-01 DIAGNOSIS — J069 Acute upper respiratory infection, unspecified: Secondary | ICD-10-CM | POA: Diagnosis not present

## 2019-01-14 DIAGNOSIS — Z125 Encounter for screening for malignant neoplasm of prostate: Secondary | ICD-10-CM | POA: Diagnosis not present

## 2019-01-14 DIAGNOSIS — E785 Hyperlipidemia, unspecified: Secondary | ICD-10-CM | POA: Diagnosis not present

## 2019-01-18 DIAGNOSIS — K210 Gastro-esophageal reflux disease with esophagitis: Secondary | ICD-10-CM | POA: Diagnosis not present

## 2019-01-18 DIAGNOSIS — M5134 Other intervertebral disc degeneration, thoracic region: Secondary | ICD-10-CM | POA: Diagnosis not present

## 2019-01-18 DIAGNOSIS — N400 Enlarged prostate without lower urinary tract symptoms: Secondary | ICD-10-CM | POA: Diagnosis not present

## 2019-01-18 DIAGNOSIS — E785 Hyperlipidemia, unspecified: Secondary | ICD-10-CM | POA: Diagnosis not present

## 2019-05-11 DIAGNOSIS — L89319 Pressure ulcer of right buttock, unspecified stage: Secondary | ICD-10-CM | POA: Diagnosis not present

## 2019-05-11 DIAGNOSIS — D2272 Melanocytic nevi of left lower limb, including hip: Secondary | ICD-10-CM | POA: Diagnosis not present

## 2019-05-11 DIAGNOSIS — B360 Pityriasis versicolor: Secondary | ICD-10-CM | POA: Diagnosis not present

## 2019-05-11 DIAGNOSIS — L89329 Pressure ulcer of left buttock, unspecified stage: Secondary | ICD-10-CM | POA: Diagnosis not present

## 2019-05-11 DIAGNOSIS — Z85828 Personal history of other malignant neoplasm of skin: Secondary | ICD-10-CM | POA: Diagnosis not present

## 2019-05-11 DIAGNOSIS — D2262 Melanocytic nevi of left upper limb, including shoulder: Secondary | ICD-10-CM | POA: Diagnosis not present

## 2019-05-11 DIAGNOSIS — L821 Other seborrheic keratosis: Secondary | ICD-10-CM | POA: Diagnosis not present

## 2019-05-11 DIAGNOSIS — Z872 Personal history of diseases of the skin and subcutaneous tissue: Secondary | ICD-10-CM | POA: Diagnosis not present

## 2019-05-19 DIAGNOSIS — Z23 Encounter for immunization: Secondary | ICD-10-CM | POA: Diagnosis not present

## 2019-07-21 DIAGNOSIS — K648 Other hemorrhoids: Secondary | ICD-10-CM | POA: Diagnosis not present

## 2019-07-21 DIAGNOSIS — K644 Residual hemorrhoidal skin tags: Secondary | ICD-10-CM | POA: Diagnosis not present

## 2019-07-22 DIAGNOSIS — E785 Hyperlipidemia, unspecified: Secondary | ICD-10-CM | POA: Diagnosis not present

## 2019-07-26 DIAGNOSIS — K219 Gastro-esophageal reflux disease without esophagitis: Secondary | ICD-10-CM | POA: Diagnosis not present

## 2019-07-26 DIAGNOSIS — E785 Hyperlipidemia, unspecified: Secondary | ICD-10-CM | POA: Diagnosis not present

## 2019-08-02 DIAGNOSIS — K600 Acute anal fissure: Secondary | ICD-10-CM | POA: Diagnosis not present

## 2020-08-09 ENCOUNTER — Encounter: Payer: Self-pay | Admitting: Ophthalmology

## 2020-08-09 ENCOUNTER — Other Ambulatory Visit: Payer: Self-pay

## 2020-08-15 NOTE — Discharge Instructions (Signed)

## 2020-08-16 ENCOUNTER — Other Ambulatory Visit: Payer: Self-pay

## 2020-08-16 ENCOUNTER — Other Ambulatory Visit
Admission: RE | Admit: 2020-08-16 | Discharge: 2020-08-16 | Disposition: A | Discharge status: Home / Self Care | Payer: Medicare HMO | Source: Ambulatory Visit | Attending: Ophthalmology | Admitting: Ophthalmology

## 2020-08-16 DIAGNOSIS — Z20822 Contact with and (suspected) exposure to covid-19: Secondary | ICD-10-CM | POA: Diagnosis not present

## 2020-08-16 DIAGNOSIS — Z01812 Encounter for preprocedural laboratory examination: Secondary | ICD-10-CM | POA: Diagnosis present

## 2020-08-16 LAB — SARS CORONAVIRUS 2 (TAT 6-24 HRS): SARS Coronavirus 2: NEGATIVE

## 2020-08-20 ENCOUNTER — Ambulatory Visit: Payer: Medicare HMO | Admitting: Anesthesiology

## 2020-08-20 ENCOUNTER — Other Ambulatory Visit: Payer: Self-pay

## 2020-08-20 ENCOUNTER — Ambulatory Visit
Admission: RE | Admit: 2020-08-20 | Discharge: 2020-08-20 | Disposition: A | Discharge status: Home / Self Care | Payer: Medicare HMO | Attending: Ophthalmology | Admitting: Ophthalmology

## 2020-08-20 ENCOUNTER — Encounter
Admission: RE | Disposition: A | Discharge status: Home / Self Care | Payer: Self-pay | Source: Home / Self Care | Attending: Ophthalmology

## 2020-08-20 ENCOUNTER — Encounter: Payer: Self-pay | Admitting: Ophthalmology

## 2020-08-20 DIAGNOSIS — Z79899 Other long term (current) drug therapy: Secondary | ICD-10-CM | POA: Insufficient documentation

## 2020-08-20 DIAGNOSIS — Z8249 Family history of ischemic heart disease and other diseases of the circulatory system: Secondary | ICD-10-CM | POA: Insufficient documentation

## 2020-08-20 DIAGNOSIS — Z8349 Family history of other endocrine, nutritional and metabolic diseases: Secondary | ICD-10-CM | POA: Insufficient documentation

## 2020-08-20 DIAGNOSIS — H2511 Age-related nuclear cataract, right eye: Secondary | ICD-10-CM | POA: Insufficient documentation

## 2020-08-20 DIAGNOSIS — Z888 Allergy status to other drugs, medicaments and biological substances status: Secondary | ICD-10-CM | POA: Insufficient documentation

## 2020-08-20 HISTORY — DX: Dizziness and giddiness: R42

## 2020-08-20 HISTORY — PX: CATARACT EXTRACTION W/PHACO: SHX586

## 2020-08-20 SURGERY — PHACOEMULSIFICATION, CATARACT, WITH IOL INSERTION
Anesthesia: Monitor Anesthesia Care | Site: Eye | Laterality: Right

## 2020-08-20 MED ORDER — ARMC OPHTHALMIC DILATING DROPS
1.0000 "application " | OPHTHALMIC | Status: DC | PRN
  Administered 2020-08-20 (×3): 1 via OPHTHALMIC

## 2020-08-20 MED ORDER — EPINEPHRINE PF 1 MG/ML IJ SOLN
INTRAOCULAR | Status: DC | PRN
  Administered 2020-08-20: 80 mL via OPHTHALMIC

## 2020-08-20 MED ORDER — CEFUROXIME OPHTHALMIC INJECTION 1 MG/0.1 ML
INJECTION | OPHTHALMIC | Status: DC | PRN
  Administered 2020-08-20: 0.1 mL via INTRACAMERAL

## 2020-08-20 MED ORDER — LIDOCAINE HCL (PF) 2 % IJ SOLN
INTRAOCULAR | Status: DC | PRN
  Administered 2020-08-20: 1 mL via INTRAOCULAR

## 2020-08-20 MED ORDER — SODIUM HYALURONATE 10 MG/ML IO SOLN
INTRAOCULAR | Status: DC | PRN
  Administered 2020-08-20: 0.55 mL via INTRAOCULAR

## 2020-08-20 MED ORDER — LACTATED RINGERS IV SOLN: INTRAVENOUS | Status: DC

## 2020-08-20 MED ORDER — FENTANYL CITRATE (PF) 100 MCG/2ML IJ SOLN
INTRAMUSCULAR | Status: DC | PRN
  Administered 2020-08-20 (×2): 50 ug via INTRAVENOUS

## 2020-08-20 MED ORDER — SODIUM HYALURONATE 23 MG/ML IO SOLN
INTRAOCULAR | Status: DC | PRN
  Administered 2020-08-20: 0.6 mL via INTRAOCULAR

## 2020-08-20 MED ORDER — MIDAZOLAM HCL 2 MG/2ML IJ SOLN
INTRAMUSCULAR | Status: DC | PRN
  Administered 2020-08-20: 2 mg via INTRAVENOUS

## 2020-08-20 MED ORDER — TETRACAINE HCL 0.5 % OP SOLN
1.0000 [drp] | OPHTHALMIC | Status: DC | PRN
  Administered 2020-08-20 (×3): 1 [drp] via OPHTHALMIC

## 2020-08-20 SURGICAL SUPPLY — 19 items

## 2020-08-20 NOTE — H&P (Signed)
[facility]   Primary Care Physician:  Jaclyn Shaggy, MD Ophthalmologist: Dr. Willey Blade  Pre-Procedure History & Physical: HPI:  Reginald Cobb is a 80 y.o. male here for cataract surgery.   Past Medical History:  Diagnosis Date  . Arthritis   . BPH (benign prostatic hyperplasia)   . Bulging lumbar disc   . Bursitis of shoulder   . Cancer (HCC)    SKIN CANCER-BASAL CELL  . Dysrhythmia    right bundle branch block  . GERD (gastroesophageal reflux disease)   . Hemorrhoids   . History of colon polyps   . History of hemorrhoids   . History of kidney stones   . Hyperlipidemia   . Hypertension   . Interstitial cystitis    RECEIVES BLADDER INJECTIONS EVERY 6 MONTHS  . Interstitial cystitis   . Prostatitis   . Vertigo    none recently    Past Surgical History:  Procedure Laterality Date  . colonoscopy with polypectomy    . COLONOSCOPY WITH PROPOFOL N/A 04/28/2018   Procedure: COLONOSCOPY WITH PROPOFOL;  Surgeon: Scot Jun, MD;  Location: [facility] ENDOSCOPY;  Service: Endoscopy;  Laterality: N/A;  . ESOPHAGOGASTRODUODENOSCOPY (EGD) WITH PROPOFOL N/A 02/22/2016   Procedure: ESOPHAGOGASTRODUODENOSCOPY (EGD) WITH PROPOFOL;  Surgeon: Scot Jun, MD;  Location: [facility] ENDOSCOPY;  Service: Endoscopy;  Laterality: N/A;  . GREEN LIGHT LASER TURP (TRANSURETHRAL RESECTION OF PROSTATE N/A 05/19/2017   Procedure: GREEN LIGHT LASER TURP (TRANSURETHRAL RESECTION OF PROSTATE;  Surgeon: Orson Ape, MD;  Location: [facility] ORS;  Service: Urology;  Laterality: N/A;  . HEMORRHOID BANDING    . HERNIA REPAIR    . LITHOTRIPSY    . TRANSURETHRAL RESECTION OF PROSTATE  1998    Prior to Admission medications   Medication Sig Start Date End Date Taking? Authorizing Provider  acetaminophen (TYLENOL) 650 MG CR tablet Take 325 mg by mouth 2 (two) times daily.   Yes [provider]  Coenzyme Q10 (COQ-10 PO) Take 1 tablet by mouth daily.   Yes [provider]   docusate sodium (COLACE) 100 MG capsule Take 100 mg by mouth 2 (two) times daily.   Yes [provider]  fexofenadine (ALLEGRA) 180 MG tablet Take 180 mg by mouth daily as needed for allergies or rhinitis.   Yes [provider]  hydrocortisone (ANUSOL-HC) 25 MG suppository Place 25 mg rectally 2 (two) times daily as needed.   Yes [provider]  omega-3 acid ethyl esters (LOVAZA) 1 g capsule Take 2 g by mouth 2 (two) times daily. Omega XL   Yes [provider]  Probiotic Product (PROBIOTIC DAILY PO) Take 2 tablets by mouth daily.    Yes [provider]  Red Yeast Rice 600 MG CAPS Take 1 capsule by mouth every other day. Alternates with Atorvastatin   Yes [provider]  rosuvastatin (CRESTOR) 10 MG tablet Take 10 mg by mouth daily.   Yes [provider]    Allergies as of 07/26/2020 - Review Complete 04/28/2018  Allergen Reaction Noted  . Levaquin [levofloxacin] Other (See Comments) 05/08/2017    Family History  Problem Relation Age of Onset  . Heart disease Mother   . Hyperlipidemia Father   . Heart failure Father   . Heart disease Brother   . Heart failure Brother     Social History   Socioeconomic History  . Marital status: Married    Spouse name: Not on file  . Number of children:  Not on file  . Years of education: Not on file  . Highest education level: Not on file  Occupational History  . Not on file  Tobacco Use  . Smoking status: Never Smoker  . Smokeless tobacco: Never Used  Vaping Use  . Vaping Use: Never used  Substance and Sexual Activity  . Alcohol use: No  . Drug use: No  . Sexual activity: Not on file  Other Topics Concern  . Not on file  Social History Narrative  . Not on file   Social Determinants of Health   Financial Resource Strain: Not on file  Food Insecurity: Not on file  Transportation Needs: Not on file  Physical Activity: Not on file  Stress: Not on file  Social  Connections: Not on file  Intimate Partner Violence: Not on file    Review of Systems: See HPI, otherwise negative ROS  Physical Exam: BP (!) 147/94   Pulse 79   Temp 98.2 F (36.8 C) (Temporal)   Ht 6' (1.829 m)   Wt 92.1 kg   SpO2 100%   BMI 27.53 kg/m  General:   Alert,  pleasant and cooperative in NAD Head:  Normocephalic and atraumatic. Respiratory:  Normal work of breathing.  Impression/Plan: Reginald Cobb is here for cataract surgery.  Risks, benefits, limitations, and alternatives regarding cataract surgery have been reviewed with the patient.  Questions have been answered.  All parties agreeable.   Benay Pillow, MD  08/20/2020, 10:03 AM

## 2020-08-20 NOTE — Anesthesia Postprocedure Evaluation (Signed)
Anesthesia Post Note  Patient: Reginald Cobb  Procedure(s) Performed: CATARACT EXTRACTION PHACO AND INTRAOCULAR LENS PLACEMENT (IOC) RIGHT (Right Eye)     Patient location during evaluation: PACU Anesthesia Type: MAC Level of consciousness: awake and alert Pain management: pain level controlled Vital Signs Assessment: post-procedure vital signs reviewed and stable Respiratory status: spontaneous breathing Cardiovascular status: stable Anesthetic complications: no   No complications documented.  Gillian Scarce

## 2020-08-20 NOTE — Anesthesia Procedure Notes (Signed)
Procedure Name: MAC Date/Time: 08/20/2020 10:12 AM Performed by: Jeannene Patella, CRNA Pre-anesthesia Checklist: Patient identified, Emergency Drugs available, Suction available, Timeout performed and Patient being monitored Patient Re-evaluated:Patient Re-evaluated prior to induction Oxygen Delivery Method: Nasal cannula Placement Confirmation: positive ETCO2

## 2020-08-20 NOTE — Transfer of Care (Signed)
Immediate Anesthesia Transfer of Care Note  Patient: Reginald Cobb  Procedure(s) Performed: CATARACT EXTRACTION PHACO AND INTRAOCULAR LENS PLACEMENT (IOC) RIGHT (Right Eye)  Patient Location: PACU  Anesthesia Type: MAC  Level of Consciousness: awake, alert  and patient cooperative  Airway and Oxygen Therapy: Patient Spontanous Breathing and Patient connected to supplemental oxygen  Post-op Assessment: Post-op Vital signs reviewed, Patient's Cardiovascular Status Stable, Respiratory Function Stable, Patent Airway and No signs of Nausea or vomiting  Post-op Vital Signs: Reviewed and stable  Complications: No complications documented.

## 2020-08-20 NOTE — Anesthesia Preprocedure Evaluation (Addendum)
Anesthesia Evaluation  Patient identified by MRN, date of birth, ID band Patient awake    Reviewed: Allergy & Precautions, H&P , NPO status , Patient's Chart, lab work & pertinent test results  Airway Mallampati: II  TM Distance: >3 FB Neck ROM: full    Dental no notable dental hx.    Pulmonary neg pulmonary ROS,    Pulmonary exam normal        Cardiovascular hypertension, On Medications Normal cardiovascular exam Rhythm:regular Rate:Normal     Neuro/Psych negative neurological ROS  negative psych ROS   GI/Hepatic Neg liver ROS, Medicated,  Endo/Other  negative endocrine ROS  Renal/GU negative Renal ROS  negative genitourinary   Musculoskeletal   Abdominal   Peds  Hematology negative hematology ROS (+)   Anesthesia Other Findings   Reproductive/Obstetrics                            Anesthesia Physical Anesthesia Plan  ASA: II  Anesthesia Plan: MAC   Post-op Pain Management:    Induction:   PONV Risk Score and Plan:   Airway Management Planned:   Additional Equipment:   Intra-op Plan:   Post-operative Plan:   Informed Consent: I have reviewed the patients History and Physical, chart, labs and discussed the procedure including the risks, benefits and alternatives for the proposed anesthesia with the patient or authorized representative who has indicated his/her understanding and acceptance.       Plan Discussed with:   Anesthesia Plan Comments:         Anesthesia Quick Evaluation

## 2020-08-20 NOTE — Op Note (Signed)
OPERATIVE NOTE  Reginald Cobb [id number] 08/20/2020   PREOPERATIVE DIAGNOSIS:  Nuclear sclerotic cataract right eye.  H25.11   POSTOPERATIVE DIAGNOSIS:    Nuclear sclerotic cataract right eye.     PROCEDURE:  Phacoemusification with posterior chamber intraocular lens placement of the right eye   LENS:   Implant Name Type Inv. Item Serial No. Manufacturer Lot No. LRB No. Used Action  LENS IOL TECNIS EYHANCE 21.0 - [id number] Intraocular Lens LENS IOL TECNIS EYHANCE 21.0 [id number] JOHNSON   Right 1 Implanted       Procedure(s) with comments: CATARACT EXTRACTION PHACO AND INTRAOCULAR LENS PLACEMENT (IOC) RIGHT (Right) - 11.49 0:57.7  DIB00 +21.0   ULTRASOUND TIME: 0 minutes 57 seconds.  CDE 11.49   SURGEON:  Willey Blade, MD, MPH  ANESTHESIOLOGIST: Anesthesiologist: Jolayne Panther, MD CRNA: Jinny Blossom, CRNA   ANESTHESIA:  Topical with tetracaine drops augmented with 1% preservative-free intracameral lidocaine.  ESTIMATED BLOOD LOSS: less than 1 mL.   COMPLICATIONS:  None.   DESCRIPTION OF PROCEDURE:  The patient was identified in the holding room and transported to the operating room and placed in the supine position under the operating microscope.  The right eye was identified as the operative eye and it was prepped and draped in the usual sterile ophthalmic fashion.   A 1.0 millimeter clear-corneal paracentesis was made at the 10:30 position. 0.5 ml of preservative-free 1% lidocaine with epinephrine was injected into the anterior chamber.  The anterior chamber was filled with Healon 5 viscoelastic.  A 2.4 millimeter keratome was used to make a near-clear corneal incision at the 8:00 position.  A curvilinear capsulorrhexis was made with a cystotome and capsulorrhexis forceps.  Balanced salt solution was used to hydrodissect and hydrodelineate the nucleus.   Phacoemulsification was then used in stop and chop fashion to remove the lens nucleus and epinucleus.   The remaining cortex was then removed using the irrigation and aspiration handpiece. Healon was then placed into the capsular bag to distend it for lens placement.  A lens was then injected into the capsular bag.  The remaining viscoelastic was aspirated.   Wounds were hydrated with balanced salt solution.  The anterior chamber was inflated to a physiologic pressure with balanced salt solution.   Intracameral cefuroxime 0.1 mL at 10 mg/mL was injected into the eye.  No wound leaks were noted.  The patient was taken to the recovery room in stable condition without complications of anesthesia or surgery  Willey Blade 08/20/2020, 10:33 AM

## 2020-08-22 ENCOUNTER — Encounter: Payer: Self-pay | Admitting: Ophthalmology

## 2020-08-27 ENCOUNTER — Encounter: Payer: Self-pay | Admitting: Anesthesiology

## 2020-08-30 ENCOUNTER — Other Ambulatory Visit
Admission: RE | Admit: 2020-08-30 | Discharge: 2020-08-30 | Disposition: A | Discharge status: Home / Self Care | Payer: Medicare HMO | Source: Ambulatory Visit | Attending: Ophthalmology | Admitting: Ophthalmology

## 2020-08-30 ENCOUNTER — Other Ambulatory Visit: Payer: Self-pay

## 2020-08-30 DIAGNOSIS — Z01812 Encounter for preprocedural laboratory examination: Secondary | ICD-10-CM | POA: Insufficient documentation

## 2020-08-30 DIAGNOSIS — Z20822 Contact with and (suspected) exposure to covid-19: Secondary | ICD-10-CM | POA: Diagnosis not present

## 2020-08-30 LAB — SARS CORONAVIRUS 2 (TAT 6-24 HRS): SARS Coronavirus 2: NEGATIVE

## 2020-08-30 NOTE — Discharge Instructions (Signed)

## 2020-09-06 ENCOUNTER — Other Ambulatory Visit
Admission: RE | Admit: 2020-09-06 | Discharge: 2020-09-06 | Disposition: A | Discharge status: Home / Self Care | Payer: Medicare HMO | Source: Ambulatory Visit | Attending: Ophthalmology | Admitting: Ophthalmology

## 2020-09-06 ENCOUNTER — Other Ambulatory Visit: Payer: Self-pay

## 2020-09-06 DIAGNOSIS — Z20822 Contact with and (suspected) exposure to covid-19: Secondary | ICD-10-CM | POA: Insufficient documentation

## 2020-09-06 DIAGNOSIS — Z01812 Encounter for preprocedural laboratory examination: Secondary | ICD-10-CM | POA: Insufficient documentation

## 2020-09-07 LAB — SARS CORONAVIRUS 2 (TAT 6-24 HRS): SARS Coronavirus 2: NEGATIVE

## 2020-09-10 ENCOUNTER — Ambulatory Visit
Admission: RE | Admit: 2020-09-10 | Discharge: 2020-09-10 | Disposition: A | Discharge status: Home / Self Care | Payer: Medicare HMO | Attending: Ophthalmology | Admitting: Ophthalmology

## 2020-09-10 ENCOUNTER — Other Ambulatory Visit: Payer: Self-pay

## 2020-09-10 ENCOUNTER — Encounter: Payer: Self-pay | Admitting: Ophthalmology

## 2020-09-10 ENCOUNTER — Encounter: Payer: Self-pay | Admitting: Anesthesiology

## 2020-09-10 ENCOUNTER — Encounter
Admission: RE | Disposition: A | Discharge status: Home / Self Care | Payer: Self-pay | Source: Home / Self Care | Attending: Ophthalmology

## 2020-09-10 DIAGNOSIS — Z79899 Other long term (current) drug therapy: Secondary | ICD-10-CM | POA: Insufficient documentation

## 2020-09-10 DIAGNOSIS — H2512 Age-related nuclear cataract, left eye: Secondary | ICD-10-CM | POA: Diagnosis not present

## 2020-09-10 DIAGNOSIS — Z85828 Personal history of other malignant neoplasm of skin: Secondary | ICD-10-CM | POA: Insufficient documentation

## 2020-09-10 HISTORY — PX: CATARACT EXTRACTION W/PHACO: SHX586

## 2020-09-10 SURGERY — PHACOEMULSIFICATION, CATARACT, WITH IOL INSERTION
Anesthesia: Monitor Anesthesia Care | Site: Eye | Laterality: Left

## 2020-09-10 MED ORDER — LIDOCAINE HCL (PF) 2 % IJ SOLN
INTRAOCULAR | Status: DC | PRN
  Administered 2020-09-10: 1 mL via INTRAOCULAR

## 2020-09-10 MED ORDER — MOXIFLOXACIN HCL 0.5 % OP SOLN
OPHTHALMIC | Status: DC | PRN
  Administered 2020-09-10: 0.2 mL via OPHTHALMIC

## 2020-09-10 MED ORDER — ACETAMINOPHEN 325 MG PO TABS: 325.0000 mg | ORAL_TABLET | Freq: Once | ORAL | Status: DC

## 2020-09-10 MED ORDER — SODIUM HYALURONATE 23 MG/ML IO SOLN
INTRAOCULAR | Status: DC | PRN
  Administered 2020-09-10: 0.6 mL via INTRAOCULAR

## 2020-09-10 MED ORDER — ARMC OPHTHALMIC DILATING DROPS
1.0000 "application " | OPHTHALMIC | Status: DC | PRN
  Administered 2020-09-10 (×3): 1 via OPHTHALMIC

## 2020-09-10 MED ORDER — FENTANYL CITRATE (PF) 100 MCG/2ML IJ SOLN
INTRAMUSCULAR | Status: DC | PRN
  Administered 2020-09-10: 50 ug via INTRAVENOUS

## 2020-09-10 MED ORDER — MIDAZOLAM HCL 2 MG/2ML IJ SOLN
INTRAMUSCULAR | Status: DC | PRN
  Administered 2020-09-10: 1.5 mg via INTRAVENOUS

## 2020-09-10 MED ORDER — EPINEPHRINE PF 1 MG/ML IJ SOLN
INTRAOCULAR | Status: DC | PRN
  Administered 2020-09-10: 85 mL via OPHTHALMIC

## 2020-09-10 MED ORDER — TETRACAINE HCL 0.5 % OP SOLN
1.0000 [drp] | OPHTHALMIC | Status: DC | PRN
  Administered 2020-09-10 (×3): 1 [drp] via OPHTHALMIC

## 2020-09-10 MED ORDER — LACTATED RINGERS IV SOLN: INTRAVENOUS | Status: DC

## 2020-09-10 MED ORDER — ACETAMINOPHEN 160 MG/5ML PO SOLN: 325.0000 mg | Freq: Once | ORAL | Status: DC

## 2020-09-10 MED ORDER — SODIUM HYALURONATE 10 MG/ML IO SOLN
INTRAOCULAR | Status: DC | PRN
  Administered 2020-09-10: 0.55 mL via INTRAOCULAR

## 2020-09-10 SURGICAL SUPPLY — 17 items
CANNULA ANT/CHMB 27GA (MISCELLANEOUS) ×4 IMPLANT
DISSECTOR HYDRO NUCLEUS 50X22 (MISCELLANEOUS) ×2 IMPLANT
GLOVE SURG LX 7.5 STRW (GLOVE) ×2
GLOVE SURG LX STRL 7.5 STRW (GLOVE) ×2 IMPLANT
GLOVE SURG SYN 8.5  E (GLOVE) ×1
GLOVE SURG SYN 8.5 E (GLOVE) ×1 IMPLANT
GOWN STRL REUS W/ TWL LRG LVL3 (GOWN DISPOSABLE) ×2 IMPLANT
GOWN STRL REUS W/TWL LRG LVL3 (GOWN DISPOSABLE) ×4
LENS IOL TECNIS EYHANCE 21.0 (Intraocular Lens) ×2 IMPLANT
MARKER SKIN DUAL TIP RULER LAB (MISCELLANEOUS) ×2 IMPLANT
PACK DR. KING ARMS (PACKS) ×2 IMPLANT
PACK EYE AFTER SURG (MISCELLANEOUS) ×2 IMPLANT
PACK OPTHALMIC (MISCELLANEOUS) ×2 IMPLANT
SYR 3ML LL SCALE MARK (SYRINGE) ×2 IMPLANT
SYR TB 1ML LUER SLIP (SYRINGE) ×2 IMPLANT
WATER STERILE IRR 250ML POUR (IV SOLUTION) ×2 IMPLANT
WIPE NON LINTING 3.25X3.25 (MISCELLANEOUS) ×2 IMPLANT

## 2020-09-10 NOTE — Transfer of Care (Signed)
Immediate Anesthesia Transfer of Care Note  Patient: Reginald Cobb  Procedure(s) Performed: CATARACT EXTRACTION PHACO AND INTRAOCULAR LENS PLACEMENT (IOC) LEFT (Left Eye)  Patient Location: PACU  Anesthesia Type: MAC  Level of Consciousness: awake, alert  and patient cooperative  Airway and Oxygen Therapy: Patient Spontanous Breathing and Patient connected to supplemental oxygen  Post-op Assessment: Post-op Vital signs reviewed, Patient's Cardiovascular Status Stable, Respiratory Function Stable, Patent Airway and No signs of Nausea or vomiting  Post-op Vital Signs: Reviewed and stable  Complications: No complications documented.

## 2020-09-10 NOTE — H&P (Signed)
[facility]   Primary Care Physician:  Albina Billet, MD Ophthalmologist: Dr. Benay Pillow  Pre-Procedure History & Physical: HPI:  Reginald Cobb is a 80 y.o. male here for cataract surgery.   Past Medical History:  Diagnosis Date  . Arthritis   . BPH (benign prostatic hyperplasia)   . Bulging lumbar disc   . Bursitis of shoulder   . Cancer (HCC)    SKIN CANCER-BASAL CELL  . Dysrhythmia    right bundle branch block  . GERD (gastroesophageal reflux disease)   . Hemorrhoids   . History of colon polyps   . History of hemorrhoids   . History of kidney stones   . Hyperlipidemia   . Hypertension   . Interstitial cystitis    RECEIVES BLADDER INJECTIONS EVERY 6 MONTHS  . Interstitial cystitis   . Prostatitis   . Vertigo    none recently    Past Surgical History:  Procedure Laterality Date  . CATARACT EXTRACTION W/PHACO Right 08/20/2020   Procedure: CATARACT EXTRACTION PHACO AND INTRAOCULAR LENS PLACEMENT ([place]) RIGHT;  Surgeon: Eulogio Bear, MD;  Location: [place];  Service: Ophthalmology;  Laterality: Right;  11.49 0:57.7  . colonoscopy with polypectomy    . COLONOSCOPY WITH PROPOFOL N/A 04/28/2018   Procedure: COLONOSCOPY WITH PROPOFOL;  Surgeon: Manya Silvas, MD;  Location: [facility] ENDOSCOPY;  Service: Endoscopy;  Laterality: N/A;  . ESOPHAGOGASTRODUODENOSCOPY (EGD) WITH PROPOFOL N/A 02/22/2016   Procedure: ESOPHAGOGASTRODUODENOSCOPY (EGD) WITH PROPOFOL;  Surgeon: Manya Silvas, MD;  Location: [facility] ENDOSCOPY;  Service: Endoscopy;  Laterality: N/A;  . GREEN LIGHT LASER TURP (TRANSURETHRAL RESECTION OF PROSTATE N/A 05/19/2017   Procedure: GREEN LIGHT LASER TURP (TRANSURETHRAL RESECTION OF PROSTATE;  Surgeon: Royston Cowper, MD;  Location: [facility] ORS;  Service: Urology;  Laterality: N/A;  . HEMORRHOID BANDING    . HERNIA REPAIR    . LITHOTRIPSY    . TRANSURETHRAL RESECTION OF PROSTATE  1998    Prior to Admission medications   Medication Sig  Start Date End Date Taking? Authorizing Provider  acetaminophen (TYLENOL) 650 MG CR tablet Take 325 mg by mouth 2 (two) times daily.   Yes [provider]  Coenzyme Q10 (COQ-10 PO) Take 1 tablet by mouth daily.   Yes [provider]  docusate sodium (COLACE) 100 MG capsule Take 100 mg by mouth 2 (two) times daily.   Yes [provider]  fexofenadine (ALLEGRA) 180 MG tablet Take 180 mg by mouth daily as needed for allergies or rhinitis.   Yes [provider]  hydrocortisone (ANUSOL-HC) 25 MG suppository Place 25 mg rectally 2 (two) times daily as needed.   Yes [provider]  omega-3 acid ethyl esters (LOVAZA) 1 g capsule Take 2 g by mouth 2 (two) times daily. Omega XL   Yes [provider]  Probiotic Product (PROBIOTIC DAILY PO) Take 2 tablets by mouth daily.    Yes [provider]  Red Yeast Rice 600 MG CAPS Take 1 capsule by mouth every other day. Alternates with Atorvastatin   Yes [provider]  rosuvastatin (CRESTOR) 10 MG tablet Take 10 mg by mouth daily.   Yes [provider]    Allergies as of 07/26/2020 - Review Complete 04/28/2018  Allergen Reaction Noted  . Levaquin [levofloxacin] Other (See Comments) 05/08/2017    Family History  Problem Relation Age of Onset  . Heart disease Mother   . Hyperlipidemia Father   . Heart failure Father   .  Heart disease Brother   . Heart failure Brother     Social History   Socioeconomic History  . Marital status: Married    Spouse name: Not on file  . Number of children: Not on file  . Years of education: Not on file  . Highest education level: Not on file  Occupational History  . Not on file  Tobacco Use  . Smoking status: Never Smoker  . Smokeless tobacco: Never Used  Vaping Use  . Vaping Use: Never used  Substance and Sexual Activity  . Alcohol use: No  . Drug use: No  . Sexual activity: Not on file  Other Topics Concern  . Not on file   Social History Narrative  . Not on file   Social Determinants of Health   Financial Resource Strain: Not on file  Food Insecurity: Not on file  Transportation Needs: Not on file  Physical Activity: Not on file  Stress: Not on file  Social Connections: Not on file  Intimate Partner Violence: Not on file    Review of Systems: See HPI, otherwise negative ROS  Physical Exam: BP (!) 151/80   Pulse 76   Temp (!) 97.2 F (36.2 C) (Temporal)   Ht 6' (1.829 m)   Wt 92.5 kg   SpO2 99%   BMI 27.67 kg/m  General:   Alert,  pleasant and cooperative in NAD Head:  Normocephalic and atraumatic. Respiratory:  Normal work of breathing.  Impression/Plan: Reginald Cobb is here for cataract surgery.  Risks, benefits, limitations, and alternatives regarding cataract surgery have been reviewed with the patient.  Questions have been answered.  All parties agreeable.   Benay Pillow, MD  09/10/2020, 1:03 PM

## 2020-09-10 NOTE — Anesthesia Procedure Notes (Signed)
Procedure Name: MAC Performed by: Talecia Sherlin, CRNA Pre-anesthesia Checklist: Patient identified, Emergency Drugs available, Suction available, Timeout performed and Patient being monitored Patient Re-evaluated:Patient Re-evaluated prior to induction Oxygen Delivery Method: Nasal cannula Placement Confirmation: positive ETCO2       

## 2020-09-10 NOTE — Op Note (Signed)
OPERATIVE NOTE  Reginald Cobb [id number] 09/10/2020   PREOPERATIVE DIAGNOSIS:  Nuclear sclerotic cataract left eye.  H25.12   POSTOPERATIVE DIAGNOSIS:    Nuclear sclerotic cataract left eye.     PROCEDURE:  Phacoemusification with posterior chamber intraocular lens placement of the left eye   LENS:   Implant Name Type Inv. Item Serial No. Manufacturer Lot No. LRB No. Used Action  LENS IOL TECNIS EYHANCE 21.0 - [id number] Intraocular Lens LENS IOL TECNIS EYHANCE 21.0 [id number] JOHNSON   Left 1 Implanted      Procedure(s) with comments: CATARACT EXTRACTION PHACO AND INTRAOCULAR LENS PLACEMENT (IOC) LEFT (Left) - 6.35 0:44.2  DIB00 +21.0   ULTRASOUND TIME: 0 minutes 44 seconds.  CDE 6.35   SURGEON:  Benay Pillow, MD, MPH   ANESTHESIA:  Topical with tetracaine drops augmented with 1% preservative-free intracameral lidocaine.  ESTIMATED BLOOD LOSS: <1 mL   COMPLICATIONS:  None.   DESCRIPTION OF PROCEDURE:  The patient was identified in the holding room and transported to the operating room and placed in the supine position under the operating microscope.  The left eye was identified as the operative eye and it was prepped and draped in the usual sterile ophthalmic fashion.   A 1.0 millimeter clear-corneal paracentesis was made at the 5:00 position. 0.5 ml of preservative-free 1% lidocaine with epinephrine was injected into the anterior chamber.  The anterior chamber was filled with Healon 5 viscoelastic.  A 2.4 millimeter keratome was used to make a near-clear corneal incision at the 2:00 position.  A curvilinear capsulorrhexis was made with a cystotome and capsulorrhexis forceps.  Balanced salt solution was used to hydrodissect and hydrodelineate the nucleus.   Phacoemulsification was then used in stop and chop fashion to remove the lens nucleus and epinucleus.  The remaining cortex was then removed using the irrigation and aspiration handpiece. Healon was then placed into  the capsular bag to distend it for lens placement.  A lens was then injected into the capsular bag.  The remaining viscoelastic was aspirated.   Wounds were hydrated with balanced salt solution.  The anterior chamber was inflated to a physiologic pressure with balanced salt solution.  Intracameral vigamox 0.1 mL undiltued was injected into the eye and a drop placed onto the ocular surface.  No wound leaks were noted.  The patient was taken to the recovery room in stable condition without complications of anesthesia or surgery  Benay Pillow 09/10/2020, 1:32 PM

## 2020-09-10 NOTE — Anesthesia Preprocedure Evaluation (Signed)
Anesthesia Evaluation  Patient identified by MRN, date of birth, ID band Patient awake    Reviewed: Allergy & Precautions, H&P , NPO status , Patient's Chart, lab work & pertinent test results  Airway Mallampati: II  TM Distance: >3 FB Neck ROM: full    Dental no notable dental hx.    Pulmonary neg pulmonary ROS,    Pulmonary exam normal breath sounds clear to auscultation       Cardiovascular hypertension, On Medications Normal cardiovascular exam Rhythm:regular Rate:Normal     Neuro/Psych negative neurological ROS  negative psych ROS   GI/Hepatic Neg liver ROS, GERD  Medicated,  Endo/Other  negative endocrine ROS  Renal/GU negative Renal ROS  negative genitourinary   Musculoskeletal   Abdominal   Peds  Hematology negative hematology ROS (+)   Anesthesia Other Findings   Reproductive/Obstetrics                             Anesthesia Physical  Anesthesia Plan  ASA: II  Anesthesia Plan: MAC   Post-op Pain Management:    Induction:   PONV Risk Score and Plan: 1 and Treatment may vary due to age or medical condition, TIVA and Midazolam  Airway Management Planned:   Additional Equipment:   Intra-op Plan:   Post-operative Plan:   Informed Consent: I have reviewed the patients History and Physical, chart, labs and discussed the procedure including the risks, benefits and alternatives for the proposed anesthesia with the patient or authorized representative who has indicated his/her understanding and acceptance.     Dental Advisory Given  Plan Discussed with: CRNA  Anesthesia Plan Comments:         Anesthesia Quick Evaluation

## 2020-09-10 NOTE — Anesthesia Postprocedure Evaluation (Signed)
Anesthesia Post Note  Patient: Reginald Cobb  Procedure(s) Performed: CATARACT EXTRACTION PHACO AND INTRAOCULAR LENS PLACEMENT (IOC) LEFT (Left Eye)     Patient location during evaluation: PACU Anesthesia Type: MAC Level of consciousness: awake and alert and oriented Pain management: satisfactory to patient Vital Signs Assessment: post-procedure vital signs reviewed and stable Respiratory status: spontaneous breathing, nonlabored ventilation and respiratory function stable Cardiovascular status: blood pressure returned to baseline and stable Postop Assessment: Adequate PO intake and No signs of nausea or vomiting Anesthetic complications: no   No complications documented.  Raliegh Ip

## 2020-09-11 ENCOUNTER — Encounter: Payer: Self-pay | Admitting: Ophthalmology

## 2021-05-21 ENCOUNTER — Other Ambulatory Visit: Payer: Self-pay | Admitting: General Surgery

## 2021-05-21 NOTE — Progress Notes (Signed)
Subjective:     Patient ID: Reginald Cobb is a 80 y.o. male.   HPI   The following portions of the patient's history were reviewed and updated as appropriate.   This an established patient is here today for: office visit. Here to discuss having a colonoscopy, last completed in 2019 by Dr Vira Agar. Patient states he has regular bowel movements and has no new concerns. Patient denies any rectal bleeding.  The patient reports he just finished push walking 20 acres, and his perineum is a little bit sore and he notices a small bump on the external anal area that is resolved since he completed this project.   The patient underwent hemorrhoid banding by Dr. Zachery Dauer in summer 2021.  He had a small residual area of hypertrophic skin noted at the time of his April 03, 2020 exam.   Very active, just finishing remodeling a 1950s house including taking out the basement to put in a drain.   Review of Systems  Constitutional: Negative for chills and fever.  Respiratory: Negative for cough.   Gastrointestinal: Negative for anal bleeding and blood in stool.         Chief Complaint  Patient presents with   Pre-op Exam      BP 134/82   Pulse 71   Temp 36.4 C (97.5 F)   Wt 91.6 kg (202 lb)   SpO2 97%   BMI 27.40 kg/m        Past Medical History:  Diagnosis Date   Bursitis of shoulder 02/04/2018   GERD (gastroesophageal reflux disease)     History of hemorrhoids     History of prostatitis     Hyperlipidemia     Hypertension 07/06/2012    Last Assessment & Plan:  Blood pressure is well controlled on today's visit. No changes made to the medications.   Interstitial cystitis      history of   Right bundle branch block 07/06/2012    Last Assessment & Plan:  Benign EKG finding. We have discussed this with him.           Past Surgical History:  Procedure Laterality Date   COLONOSCOPY   02/17/2007, 11/29/2001    Adenomatous Polyps   COLONOSCOPY   02/06/2012    PH  Adenomatous Polyps: CBF 01/2017; Recall Ltr mailed 12/19/2016 (dw)   COLONOSCOPY   04/28/2018    Adenomatous Polyps: CBF 04/2021   EGD   02/17/2007, 06/26/2000   EGD   02/22/2016    Gastritis: No repeat per RTE   Hemorrhoid banding by Dr. Tamala Julian       HEMORRHOID SURGERY   01/2020    banding by Dr Peyton Najjar   INGUINAL HERNIA REPAIR       TURP 1998 with cystography              Social History          Socioeconomic History   Marital status: Married  Tobacco Use   Smoking status: Never Smoker   Smokeless tobacco: Never Used  Vaping Use   Vaping Use: Never used  Substance and Sexual Activity   Alcohol use: No   Drug use: No   Sexual activity: Defer        No Known Allergies   Current Medications        Current Outpatient Medications  Medication Sig Dispense Refill   acetaminophen (TYLENOL) 650 MG ER tablet Take 650 mg by mouth continuously as needed.  atorvastatin (LIPITOR) 20 MG tablet Take 20 mg by mouth once daily.          Compound Medication Med Name: Nifedipine 0.3% plus lidocaine 2% cream. Apply to anal area two times a day and after every bowel movement. (Patient taking differently: as needed Med Name: Nifedipine 0.3% plus lidocaine 2% cream. Apply to anal area two times a day and after every bowel movement.) 30 each 0   erythromycin (ROMYCIN) ophthalmic ointment         hydrocortisone 2.5 % cream Place rectally 2 (two) times daily (Patient taking differently: Place rectally as needed) 30 g 2   OMEGA-3S/DHA/EPA/FISH OIL (OMEGA 3 ORAL) Take 2 caplet by mouth once daily.       RED YEAST RICE ORAL Take 1 tablet by mouth once daily.          rosuvastatin (CRESTOR) 10 MG tablet Take 10 mg by mouth once daily       SACCHAROMYCES BOULARDII (PROBIOTIC, S.BOULARDII, ORAL) Take 1 tablet by mouth once daily.          UBIDECARENONE (COQ-10 ORAL) Take 1 tablet by mouth once daily.           No current facility-administered medications for this visit.             Family  History  Problem Relation Age of Onset   Myocardial Infarction (Heart attack) Mother     Myocardial Infarction (Heart attack) Father     Myocardial Infarction (Heart attack) Brother     Pacemaker Brother     Colon cancer Neg Hx     Breast cancer Neg Hx                   Objective:   Physical Exam Constitutional:      Appearance: Normal appearance.  Cardiovascular:     Rate and Rhythm: Normal rate and regular rhythm.     Pulses: Normal pulses.     Heart sounds: Normal heart sounds.  Pulmonary:     Effort: Pulmonary effort is normal.     Breath sounds: Normal breath sounds.  Musculoskeletal:     Cervical back: Neck supple.  Skin:    General: Skin is warm and dry.  Neurological:     Mental Status: He is alert and oriented to person, place, and time.  Psychiatric:        Mood and Affect: Mood normal.        Behavior: Behavior normal.      Labs and Radiology:    April 28, 2018 colonoscopy pathology:   DIAGNOSIS:  A.  COLON POLYP, ASCENDING; HOT SNARE:  - TUBULAR ADENOMA, MULTIPLE FRAGMENTS.  - NEGATIVE FOR HIGH-GRADE DYSPLASIA AND MALIGNANCY.   B.  COLON POLYP, HEPATIC FLEXURE; COLD BIOPSY:  - TUBULAR ADENOMA.  - NEGATIVE FOR HIGH-GRADE DYSPLASIA AND MALIGNANCY.   C.  COLON POLYP, SPLENIC FLEXURE; HOT SNARE:  - TUBULAR ADENOMA, 3 FRAGMENTS.  - NEGATIVE FOR HIGH-GRADE DYSPLASIA AND MALIGNANCY.   D.  RECTUM POLYP X 4; COLD BIOPSY:  - HYPERPLASTIC POLYPS, 4 FRAGMENTS.  - NEGATIVE FOR DYSPLASIA AND MALIGNANCY.    The colonoscopy report was reviewed.  3-year follow-up recommended by Dr. Vira Agar.      Assessment:     Candidate for repeat colonoscopy based on multiple polyps in 2019.    Plan:     Indication for the procedure was reviewed with the patient.  Colonoscopy prep discussed with the patient by the staff.  At the time of his August 2021 exam consideration was given to a follow-up in 2024 based on the number of polyps, at this time with Dr.  Percell Boston prior recommendation we will proceed to examination at this time.      This note is partially prepared by Karie Fetch, RN, acting as a scribe in the presence of Dr. Hervey Ard, MD.  The documentation recorded by the scribe accurately reflects the service I personally performed and the decisions made by me.    Robert Bellow, MD FACS

## 2021-06-11 ENCOUNTER — Encounter: Payer: Self-pay | Admitting: General Surgery

## 2021-06-12 NOTE — Anesthesia Preprocedure Evaluation (Addendum)
Anesthesia Evaluation  Patient identified by MRN, date of birth, ID band Patient awake    Reviewed: Allergy & Precautions, H&P , NPO status , Patient's Chart, lab work & pertinent test results  Airway Mallampati: II  TM Distance: >3 FB Neck ROM: full    Dental no notable dental hx. (+) Chipped   Pulmonary neg pulmonary ROS,    Pulmonary exam normal breath sounds clear to auscultation       Cardiovascular hypertension, On Medications negative cardio ROS Normal cardiovascular examDysrhythmias: RBBB.  Rhythm:regular Rate:Normal     Neuro/Psych negative neurological ROS  negative psych ROS   GI/Hepatic negative GI ROS, Neg liver ROS, GERD  Medicated,  Endo/Other  negative endocrine ROS  Renal/GU negative Renal ROS  negative genitourinary   Musculoskeletal  (+) Arthritis ,   Abdominal Normal abdominal exam  (+)   Peds  Hematology negative hematology ROS (+)   Anesthesia Other Findings Past Medical History: No date: Arthritis No date: BPH (benign prostatic hyperplasia) No date: Bulging lumbar disc 06/05/2012: Bundle branch block, right No date: Bursitis of shoulder No date: Cancer ([place])     Comment:  SKIN CANCER-BASAL CELL No date: Chronic kidney disease No date: Dysrhythmia     Comment:  right bundle branch block No date: GERD (gastroesophageal reflux disease) No date: Hemorrhoids No date: History of colon polyps No date: History of hemorrhoids No date: History of kidney stones No date: Hyperlipidemia No date: Hypertension No date: Interstitial cystitis     Comment:  RECEIVES BLADDER INJECTIONS EVERY 6 MONTHS No date: Interstitial cystitis No date: Prostatitis No date: Vertigo     Comment:  none recently  Past Surgical History: No date: adenomatous polyps 08/20/2020: CATARACT EXTRACTION W/PHACO; Right     Comment:  Procedure: CATARACT EXTRACTION PHACO AND INTRAOCULAR               LENS PLACEMENT ([place])  RIGHT;  Surgeon: Eulogio Bear,              MD;  Location: [place];  Service:               Ophthalmology;  Laterality: Right;  11.49 0:57.7 09/10/2020: CATARACT EXTRACTION W/PHACO; Left     Comment:  Procedure: CATARACT EXTRACTION PHACO AND INTRAOCULAR               LENS PLACEMENT ([place]) LEFT;  Surgeon: Eulogio Bear,               MD;  Location: [place];  Service:               Ophthalmology;  Laterality: Left;  6.35 0:44.2 No date: colonoscopy with polypectomy 04/28/2018: COLONOSCOPY WITH PROPOFOL; N/A     Comment:  Procedure: COLONOSCOPY WITH PROPOFOL;  Surgeon: Manya Silvas, MD;  Location: [facility] ENDOSCOPY;  Service:               Endoscopy;  Laterality: N/A; 02/22/2016: ESOPHAGOGASTRODUODENOSCOPY (EGD) WITH PROPOFOL; N/A     Comment:  Procedure: ESOPHAGOGASTRODUODENOSCOPY (EGD) WITH               PROPOFOL;  Surgeon: Manya Silvas, MD;  Location: [facility]              ENDOSCOPY;  Service: Endoscopy;  Laterality: N/A; 05/19/2017: GREEN LIGHT LASER TURP (TRANSURETHRAL RESECTION OF  PROSTATE; N/A     Comment:  Procedure: GREEN  LIGHT LASER TURP (TRANSURETHRAL               RESECTION OF PROSTATE;  Surgeon: Royston Cowper, MD;                Location: [facility] ORS;  Service: Urology;  Laterality: N/A; No date: HEMORRHOID BANDING No date: HERNIA REPAIR No date: LITHOTRIPSY 1998: TRANSURETHRAL RESECTION OF PROSTATE  BMI    Body Mass Index: 26.45 kg/m      Reproductive/Obstetrics negative OB ROS                           Anesthesia Physical  Anesthesia Plan  ASA: 2  Anesthesia Plan: General   Post-op Pain Management:    Induction: Intravenous  PONV Risk Score and Plan: 0 and Treatment may vary due to age or medical condition and TIVA  Airway Management Planned: Nasal Cannula and Natural Airway  Additional Equipment:   Intra-op Plan:   Post-operative Plan:   Informed Consent: I have reviewed the  patients History and Physical, chart, labs and discussed the procedure including the risks, benefits and alternatives for the proposed anesthesia with the patient or authorized representative who has indicated his/her understanding and acceptance.     Dental Advisory Given  Plan Discussed with: CRNA and Surgeon  Anesthesia Plan Comments:        Anesthesia Quick Evaluation

## 2021-07-16 ENCOUNTER — Encounter: Payer: Self-pay | Admitting: General Surgery

## 2021-07-17 ENCOUNTER — Ambulatory Visit: Payer: Medicare HMO | Admitting: Anesthesiology

## 2021-07-17 ENCOUNTER — Encounter: Payer: Self-pay | Admitting: General Surgery

## 2021-07-17 ENCOUNTER — Ambulatory Visit
Admission: RE | Admit: 2021-07-17 | Discharge: 2021-07-17 | Disposition: A | Discharge status: Home / Self Care | Payer: Medicare HMO | Attending: General Surgery | Admitting: General Surgery

## 2021-07-17 ENCOUNTER — Encounter
Admission: RE | Disposition: A | Discharge status: Home / Self Care | Payer: Self-pay | Source: Home / Self Care | Attending: General Surgery

## 2021-07-17 DIAGNOSIS — I10 Essential (primary) hypertension: Secondary | ICD-10-CM | POA: Insufficient documentation

## 2021-07-17 DIAGNOSIS — I4510 Unspecified right bundle-branch block: Secondary | ICD-10-CM | POA: Diagnosis not present

## 2021-07-17 DIAGNOSIS — Z1211 Encounter for screening for malignant neoplasm of colon: Secondary | ICD-10-CM | POA: Diagnosis present

## 2021-07-17 DIAGNOSIS — Z86010 Personal history of colonic polyps: Secondary | ICD-10-CM | POA: Insufficient documentation

## 2021-07-17 DIAGNOSIS — Z79899 Other long term (current) drug therapy: Secondary | ICD-10-CM | POA: Insufficient documentation

## 2021-07-17 HISTORY — DX: Chronic kidney disease, unspecified: N18.9

## 2021-07-17 HISTORY — PX: COLONOSCOPY WITH PROPOFOL: SHX5780

## 2021-07-17 SURGERY — COLONOSCOPY WITH PROPOFOL
Anesthesia: General

## 2021-07-17 MED ORDER — PROPOFOL 10 MG/ML IV BOLUS
INTRAVENOUS | Status: DC | PRN
  Administered 2021-07-17: 90 mg via INTRAVENOUS
  Administered 2021-07-17: 10 mg via INTRAVENOUS

## 2021-07-17 MED ORDER — SODIUM CHLORIDE 0.9 % IV SOLN: INTRAVENOUS | Status: DC

## 2021-07-17 MED ORDER — PROPOFOL 500 MG/50ML IV EMUL
INTRAVENOUS | Status: AC
  Filled 2021-07-17: qty 50

## 2021-07-17 MED ORDER — LIDOCAINE HCL (CARDIAC) PF 100 MG/5ML IV SOSY
PREFILLED_SYRINGE | INTRAVENOUS | Status: DC | PRN
  Administered 2021-07-17: 40 mg via INTRAVENOUS

## 2021-07-17 MED ORDER — PROPOFOL 500 MG/50ML IV EMUL
INTRAVENOUS | Status: DC | PRN
  Administered 2021-07-17: 150 ug/kg/min via INTRAVENOUS

## 2021-07-17 NOTE — Op Note (Signed)
[facility] Gastroenterology Patient Name: Reginald Cobb Procedure Date: 07/17/2021 10:23 AM MRN: [id number] Account #: [id number] Date of Birth: March 31, 1941 Admit Type: Outpatient Age: 80 Room: [facility] ENDO ROOM 1 Gender: Male Note Status: Finalized Instrument Name: Peds Colonoscope [id number] Procedure:             Colonoscopy Indications:           High risk colon cancer surveillance: Personal history                         of colonic polyps Providers:             Robert Bellow, MD Referring MD:          Leona Carry. Hall Busing, MD (Referring MD) Medicines:             Propofol per Anesthesia Complications:         No immediate complications. Procedure:             Pre-Anesthesia Assessment:                        - Prior to the procedure, a History and Physical was                         performed, and patient medications, allergies and                         sensitivities were reviewed. The patient's tolerance                         of previous anesthesia was reviewed.                        - The risks and benefits of the procedure and the                         sedation options and risks were discussed with the                         patient. All questions were answered and informed                         consent was obtained.                        After obtaining informed consent, the colonoscope was                         passed under direct vision. Throughout the procedure,                         the patient's blood pressure, pulse, and oxygen                         saturations were monitored continuously. The                         Colonoscope was introduced through the anus and  advanced to the the cecum, identified by appendiceal                         orifice and ileocecal valve. The colonoscopy was                         performed without difficulty. The patient tolerated                         the procedure well.  The quality of the bowel                         preparation was good. Findings:      The entire examined colon appeared normal on direct and retroflexion       views. Impression:            - The entire examined colon is normal on direct and                         retroflexion views.                        - No specimens collected. Recommendation:        - Consider repeat colonoscopy in 7 years for                         surveillance based on health at the time. Procedure Code(s):     --- Professional ---                        [phone], Colonoscopy, flexible; diagnostic, including                         collection of specimen(s) by brushing or washing, when                         performed (separate procedure) Diagnosis Code(s):     --- Professional ---                        Z86.010, Personal history of colonic polyps CPT copyright 2019 American Medical Association. All rights reserved. The codes documented in this report are preliminary and upon coder review may  be revised to meet current compliance requirements. Robert Bellow, MD 07/17/2021 10:54:03 AM This report has been signed electronically. Number of Addenda: 0 Note Initiated On: 07/17/2021 10:23 AM Scope Withdrawal Time: 0 hours 12 minutes 8 seconds  Total Procedure Duration: 0 hours 17 minutes 43 seconds  Estimated Blood Loss:  Estimated blood loss: none.      [facility]

## 2021-07-17 NOTE — Anesthesia Postprocedure Evaluation (Signed)
Anesthesia Post Note  Patient: Reginald Cobb  Procedure(s) Performed: COLONOSCOPY WITH PROPOFOL  Patient location during evaluation: PACU Anesthesia Type: General Level of consciousness: awake and awake and alert Pain management: satisfactory to patient Vital Signs Assessment: post-procedure vital signs reviewed and stable Respiratory status: spontaneous breathing and respiratory function stable Cardiovascular status: blood pressure returned to baseline Anesthetic complications: no   No notable events documented.   Last Vitals:  Vitals:   07/17/21 1056 07/17/21 1106  BP: 105/61 122/71  Pulse: 66 64  Resp: 14 13  Temp: (!) 36.2 C   SpO2: 98% 99%    Last Pain:  Vitals:   07/17/21 1106  TempSrc:   PainSc: Asleep                 VAN STAVEREN,Cacey Willow

## 2021-07-17 NOTE — H&P (Signed)
Reginald Cobb [id number] [birth year]     HPI: 80 y/o male with multiple polyps in 2019. For follow exam. Tolerated prep well.   Medications Prior to Admission  Medication Sig Dispense Refill Last Dose   atorvastatin (LIPITOR) 20 MG tablet Take 20 mg by mouth daily.      Coenzyme Q10 (COQ-10 PO) Take 1 tablet by mouth daily.   07/16/2021   erythromycin ophthalmic ointment 1 application at bedtime.      Probiotic Product (PROBIOTIC DAILY PO) Take 2 tablets by mouth daily.    07/16/2021   acetaminophen (TYLENOL) 650 MG CR tablet Take 325 mg by mouth 2 (two) times daily.      docusate sodium (COLACE) 100 MG capsule Take 100 mg by mouth 2 (two) times daily.      fexofenadine (ALLEGRA) 180 MG tablet Take 180 mg by mouth daily as needed for allergies or rhinitis.      hydrocortisone (ANUSOL-HC) 25 MG suppository Place 25 mg rectally 2 (two) times daily as needed.      omega-3 acid ethyl esters (LOVAZA) 1 g capsule Take 2 g by mouth 2 (two) times daily. Omega XL      Red Yeast Rice 600 MG CAPS Take 1 capsule by mouth every other day. Alternates with Atorvastatin      rosuvastatin (CRESTOR) 10 MG tablet Take 10 mg by mouth daily.      Allergies  Allergen Reactions   Levaquin [Levofloxacin] Other (See Comments)    EXACERBATES PT'S INTERSTITIAL CYSTITIS   Past Medical History:  Diagnosis Date   Arthritis    BPH (benign prostatic hyperplasia)    Bulging lumbar disc    Bundle branch block, right 06/05/2012   Bursitis of shoulder    Cancer ([facility])    SKIN CANCER-BASAL CELL   Chronic kidney disease    Dysrhythmia    right bundle branch block   GERD (gastroesophageal reflux disease)    Hemorrhoids    History of colon polyps    History of hemorrhoids    History of kidney stones    Hyperlipidemia    Hypertension    Interstitial cystitis    RECEIVES BLADDER INJECTIONS EVERY 6 MONTHS   Interstitial cystitis    Prostatitis    Vertigo    none recently   Past Surgical History:   Procedure Laterality Date   adenomatous polyps     CATARACT EXTRACTION W/PHACO Right 08/20/2020   Procedure: CATARACT EXTRACTION PHACO AND INTRAOCULAR LENS PLACEMENT ([place]) RIGHT;  Surgeon: Eulogio Bear, MD;  Location: [place];  Service: Ophthalmology;  Laterality: Right;  11.49 0:57.7   CATARACT EXTRACTION W/PHACO Left 09/10/2020   Procedure: CATARACT EXTRACTION PHACO AND INTRAOCULAR LENS PLACEMENT ([facility]) LEFT;  Surgeon: Eulogio Bear, MD;  Location: [place];  Service: Ophthalmology;  Laterality: Left;  6.35 0:44.2   colonoscopy with polypectomy     COLONOSCOPY WITH PROPOFOL N/A 04/28/2018   Procedure: COLONOSCOPY WITH PROPOFOL;  Surgeon: Manya Silvas, MD;  Location: [facility] ENDOSCOPY;  Service: Endoscopy;  Laterality: N/A;   ESOPHAGOGASTRODUODENOSCOPY (EGD) WITH PROPOFOL N/A 02/22/2016   Procedure: ESOPHAGOGASTRODUODENOSCOPY (EGD) WITH PROPOFOL;  Surgeon: Manya Silvas, MD;  Location: [facility] ENDOSCOPY;  Service: Endoscopy;  Laterality: N/A;   GREEN LIGHT LASER TURP (TRANSURETHRAL RESECTION OF PROSTATE N/A 05/19/2017   Procedure: GREEN LIGHT LASER TURP (TRANSURETHRAL RESECTION OF PROSTATE;  Surgeon: Royston Cowper, MD;  Location: [facility] ORS;  Service: Urology;  Laterality: N/A;   HEMORRHOID BANDING  HERNIA REPAIR     LITHOTRIPSY     TRANSURETHRAL RESECTION OF PROSTATE  1998   Social History   Socioeconomic History   Marital status: Married    Spouse name: Not on file   Number of children: Not on file   Years of education: Not on file   Highest education level: Not on file  Occupational History   Not on file  Tobacco Use   Smoking status: Never   Smokeless tobacco: Never  Vaping Use   Vaping Use: Never used  Substance and Sexual Activity   Alcohol use: No   Drug use: No   Sexual activity: Not on file  Other Topics Concern   Not on file  Social History Narrative   Not on file   Social Determinants of Health   Financial Resource  Strain: Not on file  Food Insecurity: Not on file  Transportation Needs: Not on file  Physical Activity: Not on file  Stress: Not on file  Social Connections: Not on file  Intimate Partner Violence: Not on file   Social History   Social History Narrative   Not on file     ROS: Negative.     PE: HEENT: Negative. Lungs: Clear. Cardio: RR.    Assessment/Plan:  Proceed with planned lower endoscopy.  Forest Gleason Lucille Crichlow 07/17/2021

## 2021-07-17 NOTE — Transfer of Care (Signed)
Immediate Anesthesia Transfer of Care Note  Patient: Reginald Cobb  Procedure(s) Performed: Procedure(s): COLONOSCOPY WITH PROPOFOL (N/A)  Patient Location: PACU and Endoscopy Unit  Anesthesia Type:General  Level of Consciousness: sedated  Airway & Oxygen Therapy: Patient Spontanous Breathing and Patient connected to nasal cannula oxygen  Post-op Assessment: Report given to RN and Post -op Vital signs reviewed and stable  Post vital signs: Reviewed and stable  Last Vitals:  Vitals:   07/17/21 0942 07/17/21 1056  BP: (!) 159/78 105/61  Pulse: 99 66  Resp: 18 14  Temp: 36.4 C (!) 36.2 C  SpO2: 76% 39%    Complications: No apparent anesthesia complications

## 2021-07-18 ENCOUNTER — Encounter: Payer: Self-pay | Admitting: General Surgery

## 2023-11-03 LAB — LAB REPORT - SCANNED: EGFR: 76

## 2024-01-28 ENCOUNTER — Telehealth: Payer: Self-pay

## 2024-01-28 NOTE — Telephone Encounter (Signed)
 Tried calling patient to schedule new patient appointment. No voice mail on either phone numbers.

## 2024-01-28 NOTE — Telephone Encounter (Signed)
 Copied from CRM [id number]. Topic: Appointments - Scheduling Inquiry for Clinic >> Jan 28, 2024  9:32 AM Bambi Bonine D wrote: Reason for CRM: Pt would like to schedule a new patient appt with Dr.Tullo. Pt stated that his wife Luvern Mcisaac and son Egbert Seidel are currently pt's of Dr.Tullo. Pt would like to receive a callback this week regarding this request.

## 2024-02-01 NOTE — Telephone Encounter (Signed)
 Dr. Tullo has already accepted pt. We have tried reaching out to pt to schedule an appt.

## 2024-02-01 NOTE — Telephone Encounter (Signed)
 Called patient, no voice mail. Unable to schedule a new patient appointment with Dr Madelon Scheuermann.     Copied from CRM [id number]. Topic: Appointments - Appointment Info/Confirmation >> Feb 01, 2024 10:10 AM Dewanda Foots wrote: Reginald Cobb is a Tullo patient and would like to know if [name] could be seen with Dr. Madelon Scheuermann as a new patient. His current PCP is retiring and she loves Dr. Madelon Scheuermann and wants him to see her please. It shows she is not taking new patients but sometimes will do so with a patient recommendation.  Thank you

## 2024-05-10 ENCOUNTER — Encounter: Payer: Self-pay | Admitting: Internal Medicine

## 2024-05-10 ENCOUNTER — Ambulatory Visit (INDEPENDENT_AMBULATORY_CARE_PROVIDER_SITE_OTHER): Admitting: Internal Medicine

## 2024-05-10 VITALS — BP 130/74 | HR 73 | Temp 97.4°F | Ht 72.0 in | Wt 189.2 lb

## 2024-05-10 DIAGNOSIS — R634 Abnormal weight loss: Secondary | ICD-10-CM

## 2024-05-10 DIAGNOSIS — D72820 Lymphocytosis (symptomatic): Secondary | ICD-10-CM

## 2024-05-10 DIAGNOSIS — I10 Essential (primary) hypertension: Secondary | ICD-10-CM

## 2024-05-10 DIAGNOSIS — K2970 Gastritis, unspecified, without bleeding: Secondary | ICD-10-CM

## 2024-05-10 DIAGNOSIS — E785 Hyperlipidemia, unspecified: Secondary | ICD-10-CM

## 2024-05-10 DIAGNOSIS — R1084 Generalized abdominal pain: Secondary | ICD-10-CM

## 2024-05-10 NOTE — Patient Instructions (Addendum)
 Welcome !   1) CT of abdomen has been ordered to rule out other causes of your abdominal pain other than gastritis   2) Your white blood cell  count has been  mildly elevated on your  past labs  so I am repeating it  to make sure it is resolving.

## 2024-05-10 NOTE — Progress Notes (Unsigned)
 Subjective:  Patient ID: Reginald Cobb, male    DOB: 01-Feb-1941  Age: 83 y.o. MRN: [id number]  CC: The primary encounter diagnosis was Unintentional weight loss. Diagnoses of Hypertension, unspecified type, Hyperlipidemia, unspecified hyperlipidemia type, Gastritis without bleeding, unspecified chronicity, unspecified gastritis type, Lymphocytosis, and Diffuse abdominal pain were also pertinent to this visit.  1   HPI Reginald Cobb presents for establishment of care   1) Gastritis symptoms : started taking omeprazole in June by dr Corlis,  but stopped it after 18 days due to new onset urinary hesitancy .  Did not start the carafate either  he has lost weight but states it was due to improved diet  3 months ago.  Has a reported weight loss of 10-12 lbs,  has gastritis symptoms have improved but states that stomach hurts when he gets upset  which is not daily . Attributes stress to a house full of people but stress is improving. The pain is improved with eating . SABRA  Has stopped drinking coffee,  stopped bread/pizza and . Uses Mylanta several times per week,  but not daily  Stools are brown and formed.  Moves daily with use of metamucil twice daily and avoid irritating hemorrhoids, history of hemorrhoid banding about 3 years .  He is willing to see GI if symptoms do not resolve   He has a history of gastritis that was treated to resolution d with liquid carafate . Has a well .  Well water was recently tested and POSITIVE for  E COLI, drinking and cooking with  bottled water .  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   He has a past surgical history that includes Hernia repair; Transurethral resection of prostate (1998); Hemorrhoid banding; Esophagogastroduodenoscopy (egd) with propofol  (N/A, 02/22/2016); Lithotripsy; Green light laser turp (transurethral resection of prostate (N/A, 05/19/2017); colonoscopy with polypectomy; Colonoscopy with propofol  (N/A, 04/28/2018); Cataract extraction w/PHACO (Right, 08/20/2020); Cataract extraction w/PHACO (Left, 09/10/2020); adenomatous polyps; and Colonoscopy with propofol  (N/A, 07/17/2021).   His family history includes Early death in his mother; Heart attack in his brother, father, and mother; Heart disease in his brother, father, and mother; Heart failure in his brother and father; Hyperlipidemia in his father.He reports that he has never smoked. He has never used smokeless tobacco. He reports that he does not drink alcohol and does not use drugs.  Outpatient Medications Prior to Visit  Medication Sig Dispense Refill   Coenzyme Q10 (COQ-10 PO) Take 1 tablet by mouth daily.     fexofenadine (ALLEGRA) 180 MG tablet Take 180 mg by mouth daily as needed for allergies or rhinitis.     hydrocortisone (ANUSOL-HC) 25 MG suppository Place 25 mg rectally 2 (two) times daily as needed.     omega-3 acid ethyl esters (LOVAZA) 1 g capsule Take 2 g by mouth 2 (two) times daily. Omega XL     omeprazole (PRILOSEC) 40 MG capsule Take 40 mg by mouth daily.     Probiotic  Product (PROBIOTIC DAILY PO) Take 2 tablets by mouth daily.      rosuvastatin (CRESTOR) 10 MG tablet Take 10 mg by mouth daily.     sucralfate (CARAFATE) 1 GM/10ML suspension Take 1 g by mouth 4 (four) times daily -  with meals and at bedtime.     tamsulosin (FLOMAX) 0.4 MG CAPS capsule Take 0.4 mg by  mouth daily.     acetaminophen  (TYLENOL ) 650 MG CR tablet Take 325 mg by mouth 2 (two) times daily. (Patient not taking: Reported on 05/10/2024)     atorvastatin (LIPITOR) 20 MG tablet Take 20 mg by mouth daily. (Patient not taking: Reported on 05/10/2024)     docusate sodium (COLACE) 100 MG capsule Take 100 mg by mouth 2 (two) times daily. (Patient not taking: Reported on 05/10/2024)     erythromycin ophthalmic ointment 1 application at bedtime.     Red Yeast Rice 600 MG CAPS Take 1 capsule by mouth every other day. Alternates with Atorvastatin     No facility-administered medications prior to visit.    Review of Systems:  Patient denies headache, fevers, malaise, unintentional weight loss, skin rash, eye pain, sinus congestion and sinus pain, sore throat, dysphagia,  hemoptysis , cough, dyspnea, wheezing, chest pain, palpitations, orthopnea, edema, abdominal pain, nausea, melena, diarrhea, constipation, flank pain, dysuria, hematuria, urinary  Frequency, nocturia, numbness, tingling, seizures,  Focal weakness, Loss of consciousness,  Tremor, insomnia, depression, anxiety, and suicidal ideation.     Objective:  BP 130/74   Pulse 73   Temp (!) 97.4 F (36.3 C) (Oral)   Ht 6' (1.829 m)   Wt 189 lb 3.2 oz (85.8 kg)   SpO2 98%   BMI 25.66 kg/m   Physical Exam Vitals reviewed.  Constitutional:      General: He is not in acute distress.    Appearance: Normal appearance. He is normal weight. He is not ill-appearing, toxic-appearing or diaphoretic.  HENT:     Head: Normocephalic.  Eyes:     General: No scleral icterus.       Right eye: No discharge.        Left eye: No discharge.     Conjunctiva/sclera: Conjunctivae normal.  Cardiovascular:     Rate and Rhythm: Normal rate and regular rhythm.     Heart sounds: Normal heart sounds.  Pulmonary:     Effort: Pulmonary effort is normal. No respiratory distress.     Breath sounds: Normal breath sounds.  Abdominal:     General: Abdomen is  flat. There is no distension.     Palpations: Abdomen is soft. There is no mass.     Tenderness: There is abdominal tenderness.     Hernia: A hernia is present.  Musculoskeletal:        General: Normal range of motion.     Cervical back: Normal range of motion.  Skin:    General: Skin is warm and dry.  Neurological:     General: No focal deficit present.     Mental Status: He is alert and oriented to person, place, and time. Mental status is at baseline.  Psychiatric:        Mood and Affect: Mood normal.        Behavior: Behavior normal.        Thought Content: Thought content normal.        Judgment: Judgment normal.     Assessment & Plan:  Unintentional weight loss -     CT ABDOMEN  PELVIS W CONTRAST; Future  Hypertension, unspecified type Assessment & Plan: Well controlled on current regimen. Renal function stable, no changes today.   Orders: -     Comprehensive metabolic panel with GFR -     Microalbumin / creatinine urine ratio  Hyperlipidemia, unspecified hyperlipidemia type Assessment & Plan: Currently taking rosuvastatin   No results found for: CHOL, HDL, LDLCALC, LDLDIRECT, TRIG, CHOLHDL   Orders: -     Lipid panel -     LDL cholesterol, direct -     TSH  Gastritis without bleeding, unspecified chronicity, unspecified gastritis type Assessment & Plan: H PYLORI BREATH TEST ORDERED DURING CURRENT NON PPI USING STATE.  Advised to Resume PPI  GI referral made for EGD  and CT abd pelvis ordered given his diffuse tenderness  Orders: -     H. pylori breath test  Lymphocytosis Assessment & Plan: Present for over 2 years based on review of chart.  Repeat needed  Lab Results  Component Value Date   WBC 9.5 02/14/2016   HGB 14.2 02/14/2016   HCT 40.5 02/14/2016   MCV 92.7 02/14/2016   PLT 216 02/14/2016     Orders: -     CBC with Differential/Platelet  Diffuse abdominal pain -     CT ABDOMEN PELVIS W CONTRAST; Future     Follow-up:  Return in about 3 months (around 08/09/2024).   I personally spent a total of 40 minutes in the care of the patient today including preparing to see the patient, getting/reviewing separately obtained history, performing a medically appropriate exam/evaluation, counseling and educating, referring and communicating with other health care professionals, documenting clinical information in the EHR, and communicating results. Verneita LITTIE Kettering, MD

## 2024-05-11 DIAGNOSIS — K2970 Gastritis, unspecified, without bleeding: Secondary | ICD-10-CM | POA: Insufficient documentation

## 2024-05-11 DIAGNOSIS — D72820 Lymphocytosis (symptomatic): Secondary | ICD-10-CM | POA: Insufficient documentation

## 2024-05-11 LAB — COMPREHENSIVE METABOLIC PANEL WITH GFR
ALT: 17 U/L (ref 0–53)
AST: 17 U/L (ref 0–37)
Albumin: 4.6 g/dL (ref 3.5–5.2)
Alkaline Phosphatase: 70 U/L (ref 39–117)
BUN: 12 mg/dL (ref 6–23)
CO2: 29 meq/L (ref 19–32)
Calcium: 9.7 mg/dL (ref 8.4–10.5)
Chloride: 101 meq/L (ref 96–112)
Creatinine, Ser: 0.97 mg/dL (ref 0.40–1.50)
GFR: 72.59 mL/min (ref >60.00)
Glucose, Bld: 79 mg/dL (ref 70–99)
Potassium: 4.1 meq/L (ref 3.5–5.1)
Sodium: 139 meq/L (ref 135–145)
Total Bilirubin: 1 mg/dL (ref 0.2–1.2)
Total Protein: 6.2 g/dL (ref 6.0–8.3)

## 2024-05-11 LAB — CBC WITH DIFFERENTIAL/PLATELET
Basophils Absolute: 0.1 K/uL (ref 0.0–0.1)
Basophils Relative: 0.9 % (ref 0.0–3.0)
Eosinophils Absolute: 0 K/uL (ref 0.0–0.7)
Eosinophils Relative: 0.3 % (ref 0.0–5.0)
HCT: 40.9 % (ref 39.0–52.0)
Hemoglobin: 13.7 g/dL (ref 13.0–17.0)
Lymphocytes Relative: 43.8 % (ref 12.0–46.0)
Lymphs Abs: 6.3 K/uL — ABNORMAL HIGH (ref 0.7–4.0)
MCHC: 33.5 g/dL (ref 30.0–36.0)
MCV: 94.1 fl (ref 78.0–100.0)
Monocytes Absolute: 1 K/uL (ref 0.1–1.0)
Monocytes Relative: 7.1 % (ref 3.0–12.0)
Neutro Abs: 6.9 K/uL (ref 1.4–7.7)
Neutrophils Relative %: 47.9 % (ref 43.0–77.0)
Platelets: 195 K/uL (ref 150.0–400.0)
RBC: 4.35 Mil/uL (ref 4.22–5.81)
RDW: 13 % (ref 11.5–15.5)
WBC: 14.4 K/uL — ABNORMAL HIGH (ref 4.0–10.5)

## 2024-05-11 LAB — LIPID PANEL
Cholesterol: 113 mg/dL (ref 0–200)
HDL: 50.9 mg/dL (ref >39.00)
LDL Cholesterol: 51 mg/dL (ref 0–99)
NonHDL: 61.96
Total CHOL/HDL Ratio: 2
Triglycerides: 53 mg/dL (ref 0.0–149.0)
VLDL: 10.6 mg/dL (ref 0.0–40.0)

## 2024-05-11 LAB — MICROALBUMIN / CREATININE URINE RATIO
Creatinine,U: 50.2 mg/dL
Microalb Creat Ratio: UNDETERMINED mg/g (ref 0.0–30.0)
Microalb, Ur: <0.7 mg/dL

## 2024-05-11 LAB — TSH: TSH: 1.62 u[IU]/mL (ref 0.35–5.50)

## 2024-05-11 LAB — LDL CHOLESTEROL, DIRECT: Direct LDL: 56 mg/dL

## 2024-05-11 NOTE — Assessment & Plan Note (Signed)
 Currently taking rosuvastatin   No results found for: CHOL, HDL, LDLCALC, LDLDIRECT, TRIG, CHOLHDL

## 2024-05-11 NOTE — Assessment & Plan Note (Addendum)
 H PYLORI BREATH TEST ORDERED DURING CURRENT NON PPI USING STATE.  Advised to Resume PPI  GI referral made for EGD  and CT abd pelvis ordered given his diffuse tenderness

## 2024-05-11 NOTE — Assessment & Plan Note (Signed)
 Present for over 2 years based on review of chart.  Repeat needed  Lab Results  Component Value Date   WBC 9.5 02/14/2016   HGB 14.2 02/14/2016   HCT 40.5 02/14/2016   MCV 92.7 02/14/2016   PLT 216 02/14/2016

## 2024-05-11 NOTE — Assessment & Plan Note (Signed)
Well controlled on current regimen. Renal function stable, no changes today. 

## 2024-05-12 LAB — H. PYLORI BREATH TEST: H. pylori Breath Test: NOT DETECTED

## 2024-05-14 ENCOUNTER — Ambulatory Visit: Payer: Self-pay | Admitting: Internal Medicine

## 2024-05-14 NOTE — Addendum Note (Signed)
 Addended by: MARYLYNN VERNEITA CROME on: 05/14/2024 05:06 PM   Modules accepted: Orders

## 2024-05-24 ENCOUNTER — Encounter: Payer: Self-pay | Admitting: Oncology

## 2024-05-24 ENCOUNTER — Inpatient Hospital Stay: Attending: Oncology | Admitting: Oncology

## 2024-05-24 ENCOUNTER — Inpatient Hospital Stay

## 2024-05-24 VITALS — BP 122/88 | HR 72 | Temp 97.9°F | Resp 18 | Ht 72.0 in | Wt 188.0 lb

## 2024-05-24 DIAGNOSIS — N189 Chronic kidney disease, unspecified: Secondary | ICD-10-CM | POA: Diagnosis not present

## 2024-05-24 DIAGNOSIS — Z860101 Personal history of adenomatous and serrated colon polyps: Secondary | ICD-10-CM | POA: Insufficient documentation

## 2024-05-24 DIAGNOSIS — N3010 Interstitial cystitis (chronic) without hematuria: Secondary | ICD-10-CM | POA: Insufficient documentation

## 2024-05-24 DIAGNOSIS — Z85828 Personal history of other malignant neoplasm of skin: Secondary | ICD-10-CM | POA: Insufficient documentation

## 2024-05-24 DIAGNOSIS — D72820 Lymphocytosis (symptomatic): Secondary | ICD-10-CM | POA: Diagnosis present

## 2024-05-24 DIAGNOSIS — N400 Enlarged prostate without lower urinary tract symptoms: Secondary | ICD-10-CM | POA: Diagnosis not present

## 2024-05-24 DIAGNOSIS — M129 Arthropathy, unspecified: Secondary | ICD-10-CM | POA: Diagnosis not present

## 2024-05-24 DIAGNOSIS — Z87442 Personal history of urinary calculi: Secondary | ICD-10-CM | POA: Diagnosis not present

## 2024-05-24 DIAGNOSIS — I129 Hypertensive chronic kidney disease with stage 1 through stage 4 chronic kidney disease, or unspecified chronic kidney disease: Secondary | ICD-10-CM | POA: Insufficient documentation

## 2024-05-24 DIAGNOSIS — K219 Gastro-esophageal reflux disease without esophagitis: Secondary | ICD-10-CM | POA: Diagnosis not present

## 2024-05-24 DIAGNOSIS — Z79899 Other long term (current) drug therapy: Secondary | ICD-10-CM | POA: Insufficient documentation

## 2024-05-24 DIAGNOSIS — E785 Hyperlipidemia, unspecified: Secondary | ICD-10-CM | POA: Diagnosis not present

## 2024-05-24 LAB — CBC WITH DIFFERENTIAL/PLATELET
Abs Immature Granulocytes: 0.07 K/uL (ref 0.00–0.07)
Basophils Absolute: 0.1 K/uL (ref 0.0–0.1)
Basophils Relative: 1 %
Eosinophils Absolute: 0.1 K/uL (ref 0.0–0.5)
Eosinophils Relative: 1 %
HCT: 42 % (ref 39.0–52.0)
Hemoglobin: 14.3 g/dL (ref 13.0–17.0)
Immature Granulocytes: 0 %
Lymphocytes Relative: 58 %
Lymphs Abs: 9.4 K/uL — ABNORMAL HIGH (ref 0.7–4.0)
MCH: 31.6 pg (ref 26.0–34.0)
MCHC: 34 g/dL (ref 30.0–36.0)
MCV: 92.9 fL (ref 80.0–100.0)
Monocytes Absolute: 1 K/uL (ref 0.1–1.0)
Monocytes Relative: 6 %
Neutro Abs: 5.6 K/uL (ref 1.7–7.7)
Neutrophils Relative %: 34 %
Platelets: 219 K/uL (ref 150–400)
RBC: 4.52 MIL/uL (ref 4.22–5.81)
RDW: 12.8 % (ref 11.5–15.5)
Smear Review: NORMAL
WBC: 16.3 K/uL — ABNORMAL HIGH (ref 4.0–10.5)
nRBC: 0 % (ref 0.0–0.2)

## 2024-05-24 NOTE — Progress Notes (Unsigned)
 Patient isn't really having any symptoms as of right now.

## 2024-05-25 ENCOUNTER — Ambulatory Visit
Admission: RE | Admit: 2024-05-25 | Discharge: 2024-05-25 | Disposition: A | Discharge status: Home / Self Care | Source: Ambulatory Visit | Attending: Internal Medicine | Admitting: Internal Medicine

## 2024-05-25 DIAGNOSIS — R1084 Generalized abdominal pain: Secondary | ICD-10-CM | POA: Diagnosis present

## 2024-05-25 DIAGNOSIS — R634 Abnormal weight loss: Secondary | ICD-10-CM | POA: Diagnosis present

## 2024-05-25 MED ORDER — IOHEXOL 300 MG/ML  SOLN
85.0000 mL | Freq: Once | INTRAMUSCULAR | Status: AC | PRN
  Administered 2024-05-25: 85 mL via INTRAVENOUS

## 2024-05-25 NOTE — Progress Notes (Signed)
 [facility]  [phone] [phone]  ID: Reginald Cobb OB: [birth year]  MR#: [id number]  RDW#:[id number]  Patient Care Team: Marylynn Verneita CROME, MD as PCP - General (Internal Medicine)  CHIEF COMPLAINT: Lymphocytosis.  INTERVAL HISTORY: Patient is an 83 year old male who was noted to have a longstanding elevated white blood cell count with lymphocyte predominance and was referred for further evaluation.  He currently feels well and is asymptomatic.  He has no neurologic complaints.  He denies any recent fevers or illnesses.  He has a good appetite and denies weight loss.  He has no chest pain, shortness of breath, cough, or hemoptysis.  He denies any nausea, vomiting, const patient, or diarrhea.  He has no urinary complaints patient feels at his baseline and offers no specific complaints today.  REVIEW OF SYSTEMS:   Review of Systems  Constitutional: Negative.  Negative for fever, malaise/fatigue and weight loss.  Respiratory: Negative.  Negative for cough, hemoptysis and shortness of breath.   Cardiovascular: Negative.  Negative for chest pain and leg swelling.  Gastrointestinal:  Negative for abdominal pain.  Genitourinary: Negative.  Negative for dysuria and hematuria.  Musculoskeletal: Negative.  Negative for back pain.  Skin: Negative.  Negative for rash.  Neurological: Negative.  Negative for dizziness, focal weakness, weakness and headaches.  Psychiatric/Behavioral: Negative.  The patient is not nervous/anxious.     As per HPI. Otherwise, a complete review of systems is negative.  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   Bursitis of shoulder    Cancer (HCC)    SKIN CANCER-BASAL CELL   Chronic kidney disease    Dysrhythmia    right bundle branch block   GERD (gastroesophageal reflux disease)     Hemorrhoids    History of colon polyps    History of hemorrhoids    History of kidney stones    Hyperlipidemia    Hypertension    Interstitial cystitis    RECEIVES BLADDER INJECTIONS EVERY 6 MONTHS   Interstitial cystitis    Prostatitis    Vertigo    none recently    PAST SURGICAL HISTORY: Past Surgical History:  Procedure Laterality Date   adenomatous polyps     CATARACT EXTRACTION W/PHACO Right 08/20/2020   Procedure: CATARACT EXTRACTION PHACO AND INTRAOCULAR LENS PLACEMENT (IOC) RIGHT;  Surgeon: Myrna Adine Anes, MD;  Location: [facility];  Service: Ophthalmology;  Laterality: Right;  11.49 0:57.7   CATARACT EXTRACTION W/PHACO Left 09/10/2020   Procedure: CATARACT EXTRACTION PHACO AND INTRAOCULAR LENS PLACEMENT (IOC) LEFT;  Surgeon: Myrna Adine Anes, MD;  Location: [facility];  Service: Ophthalmology;  Laterality: Left;  6.35 0:44.2   colonoscopy with polypectomy     COLONOSCOPY WITH PROPOFOL  N/A 04/28/2018   Procedure: COLONOSCOPY WITH PROPOFOL ;  Surgeon: Viktoria Lamar DASEN, MD;  Location: [facility] ENDOSCOPY;  Service: Endoscopy;  Laterality: N/A;   COLONOSCOPY WITH PROPOFOL  N/A 07/17/2021   Procedure: COLONOSCOPY WITH PROPOFOL ;  Surgeon: Dessa Reyes ORN, MD;  Location: [facility] ENDOSCOPY;  Service: Endoscopy;  Laterality: N/A;   ESOPHAGOGASTRODUODENOSCOPY (EGD) WITH PROPOFOL  N/A 02/22/2016   Procedure: ESOPHAGOGASTRODUODENOSCOPY (EGD) WITH PROPOFOL ;  Surgeon: Lamar DASEN Viktoria, MD;  Location: [facility] ENDOSCOPY;  Service: Endoscopy;  Laterality: N/A;   GREEN LIGHT LASER TURP (TRANSURETHRAL RESECTION OF PROSTATE N/A 05/19/2017   Procedure: GREEN LIGHT LASER TURP (TRANSURETHRAL RESECTION  OF PROSTATE;  Surgeon: Kassie Ozell SAUNDERS, MD;  Location: [facility] ORS;  Service: Urology;  Laterality: N/A;   HEMORRHOID BANDING     HERNIA REPAIR     LITHOTRIPSY     TRANSURETHRAL RESECTION OF PROSTATE  1998    FAMILY HISTORY: Family History  Problem Relation Age of Onset   Early  death Mother    Heart disease Mother    Heart attack Mother    Heart disease Father    Heart attack Father    Hyperlipidemia Father    Heart failure Father    Heart attack Brother    Heart disease Brother    Heart failure Brother     ADVANCED DIRECTIVES (Y/N):  N  HEALTH MAINTENANCE: Social History   Tobacco Use   Smoking status: Never   Smokeless tobacco: Never  Vaping Use   Vaping status: Never Used  Substance Use Topics   Alcohol use: No   Drug use: No     Colonoscopy:  PAP:  Bone density:  Lipid panel:  Allergies  Allergen Reactions   Levaquin [Levofloxacin] Other (See Comments)    EXACERBATES PT'S INTERSTITIAL CYSTITIS    Current Outpatient Medications  Medication Sig Dispense Refill   Coenzyme Q10 (COQ-10 PO) Take 1 tablet by mouth daily.     fexofenadine (ALLEGRA) 180 MG tablet Take 180 mg by mouth daily as needed for allergies or rhinitis.     hydrocortisone (ANUSOL-HC) 25 MG suppository Place 25 mg rectally 2 (two) times daily as needed.     omega-3 acid ethyl esters (LOVAZA) 1 g capsule Take 2 g by mouth 2 (two) times daily. Omega XL     omeprazole (PRILOSEC) 40 MG capsule Take 40 mg by mouth daily.     Probiotic Product (PROBIOTIC DAILY PO) Take 2 tablets by mouth daily.      rosuvastatin (CRESTOR) 10 MG tablet Take 10 mg by mouth daily.     tamsulosin (FLOMAX) 0.4 MG CAPS capsule Take 0.4 mg by mouth daily.     zinc gluconate 50 MG tablet Take 50 mg by mouth daily.     sucralfate (CARAFATE) 1 GM/10ML suspension Take 1 g by mouth 4 (four) times daily -  with meals and at bedtime. (Patient not taking: Reported on 05/24/2024)     No current facility-administered medications for this visit.    OBJECTIVE: Vitals:   05/24/24 1130  BP: 122/88  Pulse: 72  Resp: 18  Temp: 97.9 F (36.6 C)  SpO2: 98%     Body mass index is 25.5 kg/m.    ECOG FS:0 - Asymptomatic  General: Well-developed, well-nourished, no acute distress. Eyes: Pink conjunctiva,  anicteric sclera. HEENT: Normocephalic, moist mucous membranes. Lungs: No audible wheezing or coughing. Heart: Regular rate and rhythm. Abdomen: Soft, nontender, no obvious distention. Musculoskeletal: No edema, cyanosis, or clubbing. Neuro: Alert, answering all questions appropriately. Cranial nerves grossly intact. Skin: No rashes or petechiae noted. Psych: Normal affect. Lymphatics: No cervical, calvicular, axillary or inguinal LAD.   LAB RESULTS:  Lab Results  Component Value Date   NA 139 05/10/2024   K 4.1 05/10/2024   CL 101 05/10/2024   CO2 29 05/10/2024   GLUCOSE 79 05/10/2024   BUN 12 05/10/2024   CREATININE 0.97 05/10/2024   CALCIUM 9.7 05/10/2024   PROT 6.2 05/10/2024   ALBUMIN 4.6 05/10/2024   AST 17 05/10/2024   ALT 17 05/10/2024   ALKPHOS 70 05/10/2024   BILITOT 1.0 05/10/2024   GFRNONAA >60  02/14/2016   GFRAA >60 02/14/2016    Lab Results  Component Value Date   WBC 16.3 (H) 05/24/2024   NEUTROABS 5.6 05/24/2024   HGB 14.3 05/24/2024   HCT 42.0 05/24/2024   MCV 92.9 05/24/2024   PLT 219 05/24/2024     STUDIES: No results found.  ASSESSMENT: Lymphocytosis.  PLAN:    Lymphocytosis: Upon review of patient's chart, it appears he has had a mildly elevated total white blood cell count with lymphocyte predominance since at least October 2014.  Today's result is approximately his baseline at 16.3.  Peripheral blood flow cytometry is pending at time of dictation.  No further intervention is needed.  Patient does not require bone marrow today.  Return to clinic in 3 months with repeat laboratory work and further evaluation.  I spent a total of 45 minutes reviewing chart data, face-to-face evaluation with the patient, counseling and coordination of care as detailed above.   Patient expressed understanding and was in agreement with this plan. He also understands that He can call clinic at any time with any questions, concerns, or complaints.    Cancer  Staging  No matching staging information was found for the patient.   Evalene JINNY Reusing, MD   05/25/2024 6:50 AM

## 2024-05-30 LAB — COMP PANEL: LEUKEMIA/LYMPHOMA: Immunophenotypic Profile: 52

## 2024-05-31 ENCOUNTER — Ambulatory Visit (INDEPENDENT_AMBULATORY_CARE_PROVIDER_SITE_OTHER): Admitting: *Deleted

## 2024-05-31 VITALS — Ht 72.0 in | Wt 185.0 lb

## 2024-05-31 DIAGNOSIS — Z0000 Encounter for general adult medical examination without abnormal findings: Secondary | ICD-10-CM | POA: Diagnosis not present

## 2024-05-31 NOTE — Progress Notes (Signed)
 Subjective:   Reginald Cobb is a 83 y.o. who presents for a Medicare Wellness preventive visit.  As a reminder, Annual Wellness Visits don't include a physical exam, and some assessments may be limited, especially if this visit is performed virtually. We may recommend an in-person follow-up visit with your provider if needed.  Visit Complete: Virtual I connected with  Reginald Cobb on 05/31/24 by a audio enabled telemedicine application and verified that I am speaking with the correct person using two identifiers.  Patient Location: Home  Provider Location: Home Office  I discussed the limitations of evaluation and management by telemedicine. The patient expressed understanding and agreed to proceed.  Vital Signs: Because this visit was a virtual/telehealth visit, some criteria may be missing or patient reported. Any vitals not documented were not able to be obtained and vitals that have been documented are patient reported.  VideoDeclined- This patient declined Librarian, academic. Therefore the visit was completed with audio only.  Persons Participating in Visit: Patient.  AWV Questionnaire: No: Patient Medicare AWV questionnaire was not completed prior to this visit.  Cardiac Risk Factors include: advanced age (>39men, >24 women);male gender;dyslipidemia     Objective:    Today's Vitals   05/31/24 1053  Weight: 185 lb (83.9 kg)  Height: 6' (1.829 m)   Body mass index is 25.09 kg/m.     05/31/2024   11:06 AM 05/24/2024   11:26 AM 07/17/2021    9:40 AM 09/10/2020   11:46 AM 08/20/2020    9:00 AM 04/28/2018    9:01 AM 02/02/2018    3:54 PM  Advanced Directives  Does Patient Have a Medical Advance Directive? No No Yes No No No;Yes  No   Type of Dispensing optician of [place];Living will   Copy of Healthcare Power of Attorney in Chart?      Yes    Would patient like information on creating a medical advance  directive? No - Patient declined   No - Patient declined No - Patient declined       Data saved with a previous flowsheet row definition    Current Medications (verified) Outpatient Encounter Medications as of 05/31/2024  Medication Sig   Coenzyme Q10 (COQ-10 PO) Take 1 tablet by mouth daily.   fexofenadine (ALLEGRA) 180 MG tablet Take 180 mg by mouth daily as needed for allergies or rhinitis.   hydrocortisone (ANUSOL-HC) 25 MG suppository Place 25 mg rectally 2 (two) times daily as needed.   omega-3 acid ethyl esters (LOVAZA) 1 g capsule Take 2 g by mouth 2 (two) times daily. Omega XL   Probiotic Product (PROBIOTIC DAILY PO) Take 2 tablets by mouth daily.    rosuvastatin (CRESTOR) 10 MG tablet Take 10 mg by mouth daily.   sucralfate (CARAFATE) 1 GM/10ML suspension Take 1 g by mouth 4 (four) times daily -  with meals and at bedtime. (Patient taking differently: Take 1 g by mouth 2 (two) times daily.)   zinc gluconate 50 MG tablet Take 50 mg by mouth daily.   omeprazole (PRILOSEC) 40 MG capsule Take 40 mg by mouth daily. (Patient not taking: Reported on 05/31/2024)   tamsulosin (FLOMAX) 0.4 MG CAPS capsule Take 0.4 mg by mouth daily. (Patient not taking: Reported on 05/31/2024)   No facility-administered encounter medications on file as of 05/31/2024.    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   Bursitis of shoulder    Cancer (HCC)    SKIN CANCER-BASAL CELL   Chronic kidney disease    Dysrhythmia    right bundle branch block   GERD (gastroesophageal reflux disease)    Hemorrhoids    History of colon polyps    History of hemorrhoids    History of kidney stones    Hyperlipidemia    Hypertension    Interstitial cystitis    RECEIVES BLADDER INJECTIONS EVERY 6 MONTHS   Interstitial cystitis    Prostatitis     Vertigo    none recently   Past Surgical History:  Procedure Laterality Date   adenomatous polyps     CATARACT EXTRACTION W/PHACO Right 08/20/2020   Procedure: CATARACT EXTRACTION PHACO AND INTRAOCULAR LENS PLACEMENT (IOC) RIGHT;  Surgeon: Myrna Adine Anes, MD;  Location: [facility];  Service: Ophthalmology;  Laterality: Right;  11.49 0:57.7   CATARACT EXTRACTION W/PHACO Left 09/10/2020   Procedure: CATARACT EXTRACTION PHACO AND INTRAOCULAR LENS PLACEMENT (IOC) LEFT;  Surgeon: Myrna Adine Anes, MD;  Location: [facility];  Service: Ophthalmology;  Laterality: Left;  6.35 0:44.2   colonoscopy with polypectomy     COLONOSCOPY WITH PROPOFOL  N/A 04/28/2018   Procedure: COLONOSCOPY WITH PROPOFOL ;  Surgeon: Viktoria Lamar DASEN, MD;  Location: [facility];  Service: Endoscopy;  Laterality: N/A;   COLONOSCOPY WITH PROPOFOL  N/A 07/17/2021   Procedure: COLONOSCOPY WITH PROPOFOL ;  Surgeon: Dessa Reyes ORN, MD;  Location: [facility] END[facility];  Service: Endoscopy;  Laterality: N/A;   ESOPHAGOGASTRODUODENOSCOPY (EGD) WITH PROPOFOL  N/A 02/22/2016   Procedure: ESOPHAGOGASTRODUODENOSCOPY (EGD) WITH PROPOFOL ;  Surgeon: Lamar DASEN Viktoria, MD;  Location: [facility];  Service: Endoscopy;  Laterality: N/A;   GREEN LIGHT LASER TURP (TRANSURETHRAL RESECTION OF PROSTATE N/A 05/19/2017   Procedure: GREEN LIGHT LASER TURP (TRANSURETHRAL RESECTION OF PROSTATE;  Surgeon: Kassie Ozell SAUNDERS, MD;  Location: [facility];  Service: Urology;  Laterality: N/A;   HEMORRHOID BANDING     HERNIA REPAIR     LITHOTRIPSY     TRANSURETHRAL RESECTION OF PROSTATE  1998   Family History  Problem Relation Age of Onset   Early death Mother    Heart disease Mother    Heart attack Mother    Heart disease Father    Heart attack Father    Hyperlipidemia Father    Heart failure Father    Heart attack Brother    Heart disease Brother    Heart failure Brother    Social History   Socioeconomic History   Marital  status: Married    Spouse name: Not on file   Number of children: Not on file   Years of education: Not on file   Highest education level: Not on file  Occupational History   Not on file  Tobacco Use   Smoking status: Never   Smokeless tobacco: Never  Vaping Use   Vaping status: Never Used  Substance and Sexual Activity   Alcohol use: No   Drug use: No   Sexual activity: Yes  Other Topics Concern   Not on file  Social History Narrative   married   Social Drivers of Corporate investment banker Strain: Low Risk  (05/31/2024)   Overall Financial Resource Strain (CARDIA)    Difficulty of Paying Living Expenses: Not hard at all  Food Insecurity: No Food Insecurity (05/31/2024)   Hunger Vital Sign  Worried About Programme researcher, broadcasting/film/video in the Last Year: Never true    Ran Out of Food in the Last Year: Never true  Transportation Needs: No Transportation Needs (05/31/2024)   PRAPARE - Administrator, Civil Service (Medical): No    Lack of Transportation (Non-Medical): No  Physical Activity: Inactive (05/31/2024)   Exercise Vital Sign    Days of Exercise per Week: 0 days    Minutes of Exercise per Session: 0 min  Stress: No Stress Concern Present (05/31/2024)   [employer] of Occupational Health - Occupational Stress Questionnaire    Feeling of Stress: Not at all  Social Connections: Moderately Isolated (05/31/2024)   Social Connection and Isolation Panel    Frequency of Communication with Friends and Family: More than three times a week    Frequency of Social Gatherings with Friends and Family: More than three times a week    Attends Religious Services: Never    Database administrator or Organizations: No    Attends Engineer, structural: Never    Marital Status: Married    Tobacco Counseling Counseling given: Not Answered    Clinical Intake:  Pre-visit preparation completed: Yes  Pain : No/denies pain     BMI - recorded:  25.09 Nutritional Status: BMI 25 -29 Overweight Nutritional Risks: None Diabetes: No  No results found for: HGBA1C   How often do you need to have someone help you when you read instructions, pamphlets, or other written materials from your doctor or pharmacy?: 1 - Never  Interpreter Needed?: No  Information entered by :: R. Ellaina Schuler LPN   Activities of Daily Living     05/31/2024   10:55 AM  In your present state of health, do you have any difficulty performing the following activities:  Hearing? 0  Vision? 0  Difficulty concentrating or making decisions? 0  Walking or climbing stairs? 0  Dressing or bathing? 0  Doing errands, shopping? 0  Preparing Food and eating ? N  Using the Toilet? N  In the past six months, have you accidently leaked urine? N  Do you have problems with loss of bowel control? N  Managing your Medications? N  Managing your Finances? N  Housekeeping or managing your Housekeeping? N    Patient Care Team: Marylynn Verneita CROME, MD as PCP - General (Internal Medicine)  I have updated your Care Teams any recent Medical Services you may have received from other providers in the past year.     Assessment:   This is a routine wellness examination for Reginald Cobb.  Hearing/Vision screen Hearing Screening - Comments:: No issues Vision Screening - Comments:: glasses   Goals Addressed             This Visit's Progress    Patient Stated       Wants to eat right and exercise       Depression Screen     05/31/2024   11:02 AM 05/24/2024   11:44 AM 05/10/2024    2:15 PM  PHQ 2/9 Scores  PHQ - 2 Score 0 0 0  PHQ- 9 Score 0      Fall Risk     05/31/2024   10:57 AM 05/10/2024    2:15 PM  Fall Risk   Falls in the past year? 0 0  Number falls in past yr: 0 0  Injury with Fall? 0 0  Risk for fall due to : No Fall Risks No Fall Risks  Follow up Falls evaluation completed;Falls prevention discussed Falls evaluation completed    MEDICARE RISK AT HOME:   Medicare Risk at Home Any stairs in or around the home?: Yes If so, are there any without handrails?: No Home free of loose throw rugs in walkways, pet beds, electrical cords, etc?: Yes Adequate lighting in your home to reduce risk of falls?: Yes Life alert?: No Use of a cane, walker or w/c?: No Grab bars in the bathroom?: No Shower chair or bench in shower?: Yes Elevated toilet seat or a handicapped toilet?: No  TIMED UP AND GO:  Was the test performed?  No  Cognitive Function: 6CIT completed        05/31/2024   11:07 AM  6CIT Screen  What Year? 0 points  What month? 0 points  What time? 0 points  Count back from 20 0 points  Months in reverse 0 points  Repeat phrase 0 points  Total Score 0 points    Immunizations Immunization History  Administered Date(s) Administered   Fluad Quad(high Dose 65+) 09/13/2022   PFIZER(Purple Top)SARS-COV-2 Vaccination 08/25/2019, 09/15/2019, 07/24/2020    Screening Tests Health Maintenance  Topic Date Due   Medicare Annual Wellness (AWV)  Never done   DTaP/Tdap/Td (1 - Tdap) Never done   Pneumococcal Vaccine: 50+ Years (1 of 1 - PCV) Never done   Zoster Vaccines- Shingrix (1 of 2) Never done   COVID-19 Vaccine (4 - 2025-26 season) 04/18/2024   Influenza Vaccine  11/15/2024 (Originally 03/18/2024)   Meningococcal B Vaccine  Aged Out   Maintenance Items Addressed: Discussed the need to update tetanus (Tdap) and pneumonia vaccines.  Patient declines shingles and covid vaccine.  Additional Screening:  Vision Screening: Recommended annual ophthalmology exams for early detection of glaucoma and other disorders of the eye. Is the patient up to date with their annual eye exam?  Yes  Who is the provider or what is the name of the office in which the patient attends annual eye exams?  [facility]  Dental Screening: Recommended annual dental exams for proper oral hygiene  Community Resource Referral / Chronic Care Management: CRR  required this visit?  No   CCM required this visit?  No   Plan:    I have personally reviewed and noted the following in the patient's chart:   Medical and social history Use of alcohol, tobacco or illicit drugs  Current medications and supplements including opioid prescriptions. Patient is not currently taking opioid prescriptions. Functional ability and status Nutritional status Physical activity Advanced directives List of other physicians Hospitalizations, surgeries, and ER visits in previous 12 months Vitals Screenings to include cognitive, depression, and falls Referrals and appointments  In addition, I have reviewed and discussed with patient certain preventive protocols, quality metrics, and best practice recommendations. A written personalized care plan for preventive services as well as general preventive health recommendations were provided to patient.   Angeline Fredericks, LPN   [id number]   After Visit Summary: (MyChart) Due to this being a telephonic visit, the after visit summary with patients personalized plan was offered to patient via MyChart   Notes: Nothing significant to report at this time. Patient stated that he has never done an AWV visit before at a previous doctor's office. .  Patient stated that his Insurance company sent a person out one time to do a home visit.

## 2024-05-31 NOTE — Patient Instructions (Signed)
 [name],  Thank you for taking the time for your Medicare Wellness Visit. I appreciate your continued commitment to your health goals. Please review the care plan we discussed, and feel free to reach out if I can assist you further.  Medicare recommends these wellness visits once per year to help you and your care team stay ahead of potential health issues. These visits are designed to focus on prevention, allowing your provider to concentrate on managing your acute and chronic conditions during your regular appointments.  Please note that Annual Wellness Visits do not include a physical exam. Some assessments may be limited, especially if the visit was conducted virtually. If needed, we may recommend a separate in-person follow-up with your provider.  Ongoing Care Seeing your primary care provider every 3 to 6 months helps us  monitor your health and provide consistent, personalized care.  Remember to get your tetanus (Tdap) and pneumonia vaccines.  Referrals If a referral was made during today's visit and you haven't received any updates within two weeks, please contact the referred provider directly to check on the status.  Recommended Screenings:  Health Maintenance  Topic Date Due   DTaP/Tdap/Td vaccine (1 - Tdap) Never done   Pneumococcal Vaccine for age over 67 (1 of 1 - PCV) Never done   Zoster (Shingles) Vaccine (1 of 2) Never done   COVID-19 Vaccine (4 - 2025-26 season) 04/18/2024   Flu Shot  11/15/2024*   Medicare Annual Wellness Visit  05/31/2025   Meningitis B Vaccine  Aged Out  *Topic was postponed. The date shown is not the original due date.       05/31/2024   11:06 AM  Advanced Directives  Does Patient Have a Medical Advance Directive? No  Would patient like information on creating a medical advance directive? No - Patient declined   Advance Care Planning is important because it: Ensures you receive medical care that aligns with your values, goals, and  preferences. Provides guidance to your family and loved ones, reducing the emotional burden of decision-making during critical moments.  Vision: Annual vision screenings are recommended for early detection of glaucoma, cataracts, and diabetic retinopathy. These exams can also reveal signs of chronic conditions such as diabetes and high blood pressure.  Dental: Annual dental screenings help detect early signs of oral cancer, gum disease, and other conditions linked to overall health, including heart disease and diabetes.  Please see the attached documents for additional preventive care recommendations.

## 2024-08-23 ENCOUNTER — Encounter: Payer: Self-pay | Admitting: Internal Medicine

## 2024-08-23 ENCOUNTER — Ambulatory Visit: Admitting: Family

## 2024-08-23 ENCOUNTER — Ambulatory Visit (INDEPENDENT_AMBULATORY_CARE_PROVIDER_SITE_OTHER): Admitting: Internal Medicine

## 2024-08-23 VITALS — BP 127/71 | HR 61 | Temp 97.4°F | Ht 72.0 in | Wt 194.6 lb

## 2024-08-23 DIAGNOSIS — K2950 Unspecified chronic gastritis without bleeding: Secondary | ICD-10-CM

## 2024-08-23 DIAGNOSIS — H6992 Unspecified Eustachian tube disorder, left ear: Secondary | ICD-10-CM

## 2024-08-23 DIAGNOSIS — D72820 Lymphocytosis (symptomatic): Secondary | ICD-10-CM | POA: Diagnosis not present

## 2024-08-23 DIAGNOSIS — Z23 Encounter for immunization: Secondary | ICD-10-CM | POA: Diagnosis not present

## 2024-08-23 MED ORDER — OMEPRAZOLE 40 MG PO CPDR: 40.0000 mg | DELAYED_RELEASE_CAPSULE | Freq: Every day | ORAL | 1 refills | Status: AC

## 2024-08-23 MED ORDER — FLUTICASONE PROPIONATE 50 MCG/ACT NA SUSP: 2.0000 | Freq: Every day | NASAL | 6 refills | Status: AC

## 2024-08-23 MED ORDER — ROSUVASTATIN CALCIUM 10 MG PO TABS: 10.0000 mg | ORAL_TABLET | Freq: Every day | ORAL | 3 refills | Status: AC

## 2024-08-23 NOTE — Patient Instructions (Signed)
 Please suspend the carafate and start back on OMEPRAZOLE  Please take the omeprazole  AS SOON AS YOU WAKE UP,  so it can be absorbed on empty stomach.  Do not eat or drink anything except water for 30 minutes.   You may add a 2nd dose before your evening meal if you need to  If your  symptoms are not controlled,  let me know so I can make a referral to GI

## 2024-08-23 NOTE — Assessment & Plan Note (Signed)
 Present for over 2 years based on review of chart.   Hematologic workup under way fror neoplastic process Lab Results  Component Value Date   WBC 16.3 (H) 05/24/2024   HGB 14.3 05/24/2024   HCT 42.0 05/24/2024   MCV 92.9 05/24/2024   PLT 219 05/24/2024

## 2024-08-23 NOTE — Progress Notes (Signed)
 "  Subjective:  Patient ID: Reginald Cobb, male    DOB: [birth year]  Age: 84 y.o. MRN: [id number]  CC: The primary encounter diagnosis was Eustachian tube dysfunction, left. Diagnoses of Need for influenza vaccination, Chronic gastritis without bleeding, unspecified gastritis type, and Lymphocytosis were also pertinent to this visit.   HPI Reginald Cobb presents for  Chief Complaint  Patient presents with   Medical Management of Chronic Issues    3 month follow up     1) follow up on chronic abdominal pain,which started 3 years ago attributed to gastritis.  Weight loss has stopped and he has gained weight. . CT abd pelvis normal in early October .   Starting taking  carafate twice daily,  symptoms  improving.   has stopped PPI due to bladder side effects.  EGD 2017 neg for h pylori    2) BPH: using flomax . Symptoms improving,  has 1 -2 voids per night.  No hesitancy   3) cc: left ear getting stopped  up  aggravated by wind.  .  Outpatient Medications Prior to Visit  Medication Sig Dispense Refill   Coenzyme Q10 (COQ-10 PO) Take 1 tablet by mouth daily.     hydrocortisone (ANUSOL-HC) 25 MG suppository Place 25 mg rectally 2 (two) times daily as needed.     omega-3 acid ethyl esters (LOVAZA) 1 g capsule Take 2 g by mouth 2 (two) times daily. Omega XL     Probiotic Product (PROBIOTIC DAILY PO) Take 2 tablets by mouth daily.      rosuvastatin  (CRESTOR ) 10 MG tablet Take 10 mg by mouth daily.     sucralfate (CARAFATE) 1 GM/10ML suspension Take 1 g by mouth 4 (four) times daily -  with meals and at bedtime. (Patient taking differently: Take 1 g by mouth 2 (two) times daily.)     tamsulosin (FLOMAX) 0.4 MG CAPS capsule Take 0.4 mg by mouth daily.     zinc gluconate 50 MG tablet Take 50 mg by mouth daily.     fexofenadine (ALLEGRA) 180 MG tablet Take 180 mg by mouth daily as needed for allergies or rhinitis. (Patient not taking: Reported on 08/23/2024)     omeprazole  (PRILOSEC)  40 MG capsule Take 40 mg by mouth daily. (Patient not taking: Reported on 08/23/2024)     No facility-administered medications prior to visit.    Review of Systems;  Patient denies headache, fevers, malaise, unintentional weight loss, skin rash, eye pain, sinus congestion and sinus pain, sore throat, dysphagia,  hemoptysis , cough, dyspnea, wheezing, chest pain, palpitations, orthopnea, edema, abdominal pain, nausea, melena, diarrhea, constipation, flank pain, dysuria, hematuria, urinary  Frequency, nocturia, numbness, tingling, seizures,  Focal weakness, Loss of consciousness,  Tremor, insomnia, depression, anxiety, and suicidal ideation.      Objective:  BP 127/71   Pulse 61   Temp (!) 97.4 F (36.3 C) (Oral)   Ht 6' (1.829 m)   Wt 194 lb 9.6 oz (88.3 kg)   SpO2 98%   BMI 26.39 kg/m   BP Readings from Last 3 Encounters:  08/23/24 127/71  05/24/24 122/88  05/10/24 130/74    Wt Readings from Last 3 Encounters:  08/23/24 194 lb 9.6 oz (88.3 kg)  05/31/24 185 lb (83.9 kg)  05/24/24 188 lb (85.3 kg)    Physical Exam Vitals reviewed.  Constitutional:      General: He is not in acute distress.    Appearance: Normal appearance. He is normal weight. He  is not ill-appearing, toxic-appearing or diaphoretic.  HENT:     Head: Normocephalic.  Eyes:     General: No scleral icterus.       Right eye: No discharge.        Left eye: No discharge.     Conjunctiva/sclera: Conjunctivae normal.  Cardiovascular:     Rate and Rhythm: Normal rate and regular rhythm.     Heart sounds: Normal heart sounds.  Pulmonary:     Effort: Pulmonary effort is normal. No respiratory distress.     Breath sounds: Normal breath sounds.  Musculoskeletal:        General: Normal range of motion.     Cervical back: Normal range of motion.  Skin:    General: Skin is warm and dry.  Neurological:     General: No focal deficit present.     Mental Status: He is alert and oriented to person, place, and time.  Mental status is at baseline.  Psychiatric:        Mood and Affect: Mood normal.        Behavior: Behavior normal.        Thought Content: Thought content normal.        Judgment: Judgment normal.     No results found for: HGBA1C  Lab Results  Component Value Date   CREATININE 0.97 05/10/2024   CREATININE 1.06 02/14/2016   CREATININE 1.16 06/08/2013    Lab Results  Component Value Date   WBC 16.3 (H) 05/24/2024   HGB 14.3 05/24/2024   HCT 42.0 05/24/2024   PLT 219 05/24/2024   GLUCOSE 79 05/10/2024   CHOL 113 05/10/2024   TRIG 53.0 05/10/2024   HDL 50.90 05/10/2024   LDLDIRECT 56.0 05/10/2024   LDLCALC 51 05/10/2024   ALT 17 05/10/2024   AST 17 05/10/2024   NA 139 05/10/2024   K 4.1 05/10/2024   CL 101 05/10/2024   CREATININE 0.97 05/10/2024   BUN 12 05/10/2024   CO2 29 05/10/2024   TSH 1.62 05/10/2024   MICROALBUR <0.7 05/10/2024    CT ABDOMEN PELVIS W CONTRAST Result Date: 05/28/2024 CLINICAL DATA:  Diffuse abdominal pain, weight loss. Intermittent mid abdominal pain with abdominal bloating after eating x2 months. EXAM: CT ABDOMEN AND PELVIS WITH CONTRAST TECHNIQUE: Multidetector CT imaging of the abdomen and pelvis was performed using the standard protocol following bolus administration of intravenous contrast. RADIATION DOSE REDUCTION: This exam was performed according to the departmental dose-optimization program which includes automated exposure control, adjustment of the mA and/or kV according to patient size and/or use of iterative reconstruction technique. CONTRAST:  85mL OMNIPAQUE  IOHEXOL  300 MG/ML  SOLN COMPARISON:  Ultrasound November 30, 2015 CT June 09, 2013 FINDINGS: Lower chest: Bibasilar atelectasis/scarring. Hepatobiliary: Hypodensity in the dome of the liver is technically too small to accurately characterize but statistically likely benign. Gallbladder is unremarkable. No biliary ductal dilation. Pancreas: No pancreatic ductal dilation or evidence of  acute inflammation. Spleen: No splenomegaly. Adrenals/Urinary Tract: 9 mm bilateral adrenal nodules are likely small adenomas and requiring no independent imaging follow-up by consensus guidelines. No hydronephrosis. Kidneys demonstrate symmetric enhancement. Urinary bladder is unremarkable for degree of distension. Stomach/Bowel: Stomach is nondistended. No pathologic dilation of small or large bowel. Colonic diverticulosis. Vascular/Lymphatic: Aortic atherosclerosis. No enlarged abdominal or pelvic lymph nodes. Reproductive: Probable prior TURP defect in the prostate. Other: No significant abdominopelvic free fluid. Musculoskeletal: Thoracolumbar spondylosis. IMPRESSION: 1. No acute abnormality in the abdomen or pelvis. 2. Colonic diverticulosis without evidence of  acute diverticulitis. 3. Aortic atherosclerosis. Aortic Atherosclerosis (ICD10-I70.0). Electronically Signed   By: Reyes Holder M.D.   On: 05/28/2024 12:24    Assessment & Plan:  .Eustachian tube dysfunction, left -     Fluticasone  Propionate; Place 2 sprays into both nostrils daily.  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   Hematologic workup under way fror neoplastic process Lab Results  Component Value Date   WBC 16.3 (H) 05/24/2024   HGB 14.3 05/24/2024   HCT 42.0 05/24/2024   MCV 92.9 05/24/2024   PLT 219 05/24/2024      Other orders -     Omeprazole ; Take 1 capsule (40 mg total) by mouth daily.  Dispense: 90 capsule; Refill: 1  I personally spent a total of 30 minutes in the care of the patient today including preparing to see the patient, getting/reviewing separately obtained history, performing a medically appropriate exam/evaluation,  counseling and educating, placing orders, and independently interpreting results.    Follow-up: Return in about 6 months (around 02/20/2025).   Verneita LITTIE Kettering, MD "

## 2024-08-23 NOTE — Assessment & Plan Note (Signed)
 Advised to stop carafate and resume PPI

## 2024-08-29 ENCOUNTER — Inpatient Hospital Stay: Attending: Oncology

## 2024-08-29 ENCOUNTER — Inpatient Hospital Stay: Admitting: Oncology

## 2024-08-29 ENCOUNTER — Encounter: Payer: Self-pay | Admitting: Oncology

## 2024-08-29 VITALS — BP 138/78 | HR 77 | Temp 97.9°F | Resp 18 | Ht 72.0 in | Wt 193.0 lb

## 2024-08-29 DIAGNOSIS — Z85828 Personal history of other malignant neoplasm of skin: Secondary | ICD-10-CM | POA: Diagnosis not present

## 2024-08-29 DIAGNOSIS — E785 Hyperlipidemia, unspecified: Secondary | ICD-10-CM | POA: Diagnosis not present

## 2024-08-29 DIAGNOSIS — N3010 Interstitial cystitis (chronic) without hematuria: Secondary | ICD-10-CM | POA: Insufficient documentation

## 2024-08-29 DIAGNOSIS — M1990 Unspecified osteoarthritis, unspecified site: Secondary | ICD-10-CM | POA: Diagnosis not present

## 2024-08-29 DIAGNOSIS — K219 Gastro-esophageal reflux disease without esophagitis: Secondary | ICD-10-CM | POA: Insufficient documentation

## 2024-08-29 DIAGNOSIS — D72820 Lymphocytosis (symptomatic): Secondary | ICD-10-CM

## 2024-08-29 DIAGNOSIS — Z87442 Personal history of urinary calculi: Secondary | ICD-10-CM | POA: Insufficient documentation

## 2024-08-29 DIAGNOSIS — N189 Chronic kidney disease, unspecified: Secondary | ICD-10-CM | POA: Insufficient documentation

## 2024-08-29 DIAGNOSIS — Z79899 Other long term (current) drug therapy: Secondary | ICD-10-CM | POA: Insufficient documentation

## 2024-08-29 DIAGNOSIS — I129 Hypertensive chronic kidney disease with stage 1 through stage 4 chronic kidney disease, or unspecified chronic kidney disease: Secondary | ICD-10-CM | POA: Diagnosis not present

## 2024-08-29 DIAGNOSIS — N400 Enlarged prostate without lower urinary tract symptoms: Secondary | ICD-10-CM | POA: Insufficient documentation

## 2024-08-29 DIAGNOSIS — Z860101 Personal history of adenomatous and serrated colon polyps: Secondary | ICD-10-CM | POA: Insufficient documentation

## 2024-08-29 DIAGNOSIS — C9110 Chronic lymphocytic leukemia of B-cell type not having achieved remission: Secondary | ICD-10-CM | POA: Diagnosis not present

## 2024-08-29 LAB — CBC WITH DIFFERENTIAL/PLATELET
Abs Immature Granulocytes: 0.04 K/uL (ref 0.00–0.07)
Basophils Absolute: 0.1 K/uL (ref 0.0–0.1)
Basophils Relative: 1 %
Eosinophils Absolute: 0.1 K/uL (ref 0.0–0.5)
Eosinophils Relative: 1 %
HCT: 40.6 % (ref 39.0–52.0)
Hemoglobin: 13.9 g/dL (ref 13.0–17.0)
Immature Granulocytes: 0 %
Lymphocytes Relative: 56 %
Lymphs Abs: 8 K/uL — ABNORMAL HIGH (ref 0.7–4.0)
MCH: 31.9 pg (ref 26.0–34.0)
MCHC: 34.2 g/dL (ref 30.0–36.0)
MCV: 93.1 fL (ref 80.0–100.0)
Monocytes Absolute: 1 K/uL (ref 0.1–1.0)
Monocytes Relative: 7 %
Neutro Abs: 5 K/uL (ref 1.7–7.7)
Neutrophils Relative %: 35 %
Platelets: 180 K/uL (ref 150–400)
RBC: 4.36 MIL/uL (ref 4.22–5.81)
RDW: 13 % (ref 11.5–15.5)
Smear Review: NORMAL
WBC: 14.2 K/uL — ABNORMAL HIGH (ref 4.0–10.5)
nRBC: 0 % (ref 0.0–0.2)

## 2024-08-29 NOTE — Progress Notes (Unsigned)
 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  INTERVAL HISTORY: Patient returns to clinic today for repeat laboratory work and routine 39-month evaluation.  He continues to feel well and remains asymptomatic.  He has no neurologic complaints.  He denies any recent fevers or illnesses.  He has a good appetite and denies weight loss.  He has no chest pain, shortness of breath, cough, or hemoptysis.  He denies any nausea, vomiting, const patient, or diarrhea.  He has no urinary complaints.  Patient offers no specific complaints today.  REVIEW OF SYSTEMS:   Review of Systems  Constitutional: Negative.  Negative for fever, malaise/fatigue and weight loss.  Respiratory: Negative.  Negative for cough, hemoptysis and shortness of breath.   Cardiovascular: Negative.  Negative for chest pain and leg swelling.  Gastrointestinal:  Negative for abdominal pain.  Genitourinary: Negative.  Negative for dysuria and hematuria.  Musculoskeletal: Negative.  Negative for back pain.  Skin: Negative.  Negative for rash.  Neurological: Negative.  Negative for dizziness, focal weakness, weakness and headaches.  Psychiatric/Behavioral: Negative.  The patient is not nervous/anxious.     As per HPI. Otherwise, a complete review of systems is negative.  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   Bursitis of shoulder    Cancer (HCC)    SKIN CANCER-BASAL CELL   Chronic kidney disease    Dysrhythmia    right bundle branch block   GERD (gastroesophageal reflux disease)    Hemorrhoids    History of colon polyps    History of hemorrhoids    History of kidney stones     Hyperlipidemia    Hypertension    Interstitial cystitis    RECEIVES BLADDER INJECTIONS EVERY 6 MONTHS   Interstitial cystitis    Prostatitis    Vertigo    none recently    PAST SURGICAL HISTORY: Past Surgical History:  Procedure Laterality Date   adenomatous polyps     CATARACT EXTRACTION W/PHACO Right 08/20/2020   Procedure: CATARACT EXTRACTION PHACO AND INTRAOCULAR LENS PLACEMENT (IOC) RIGHT;  Surgeon: Myrna Adine Anes, MD;  Location: [facility];  Service: Ophthalmology;  Laterality: Right;  11.49 0:57.7   CATARACT EXTRACTION W/PHACO Left 09/10/2020   Procedure: CATARACT EXTRACTION PHACO AND INTRAOCULAR LENS PLACEMENT (IOC) LEFT;  Surgeon: Myrna Adine Anes, MD;  Location: [facility];  Service: Ophthalmology;  Laterality: Left;  6.35 0:44.2   colonoscopy with polypectomy     COLONOSCOPY WITH PROPOFOL  N/A 04/28/2018   Procedure: COLONOSCOPY WITH PROPOFOL ;  Surgeon: Viktoria Lamar DASEN, MD;  Location: [facility];  Service: Endoscopy;  Laterality: N/A;   COLONOSCOPY WITH PROPOFOL  N/A 07/17/2021   Procedure: COLONOSCOPY WITH PROPOFOL ;  Surgeon: Dessa Reyes ORN, MD;  Location: [facility] ENDOSCOPY;  Service: Endoscopy;  Laterality: N/A;   ESOPHAGOGASTRODUODENOSCOPY (EGD) WITH PROPOFOL  N/A 02/22/2016   Procedure: ESOPHAGOGASTRODUODENOSCOPY (EGD) WITH PROPOFOL ;  Surgeon: Lamar DASEN Viktoria, MD;  Location: [facility] ENDOSCOPY;  Service: Endoscopy;  Laterality: N/A;   GREEN LIGHT LASER TURP (TRANSURETHRAL RESECTION OF PROSTATE N/A 05/19/2017   Procedure: GREEN LIGHT LASER TURP (TRANSURETHRAL RESECTION OF PROSTATE;  Surgeon: Kassie Ozell JONELLE, MD;  Location: [facility] ORS;  Service:  Urology;  Laterality: N/A;   HEMORRHOID BANDING     HERNIA REPAIR     LITHOTRIPSY     PROSTATE SURGERY     TRANSURETHRAL RESECTION OF PROSTATE  1998    FAMILY HISTORY: Family History  Problem Relation Age of Onset   Early death Mother    Heart disease Mother    Heart attack Mother    Heart  disease Father    Heart attack Father    Hyperlipidemia Father    Heart failure Father    Heart attack Brother    Heart disease Brother    Heart failure Brother    Diabetes Brother     ADVANCED DIRECTIVES (Y/N):  N  HEALTH MAINTENANCE: Social History   Tobacco Use   Smoking status: Never   Smokeless tobacco: Never  Vaping Use   Vaping status: Never Used  Substance Use Topics   Alcohol use: No   Drug use: No     Colonoscopy:  PAP:  Bone density:  Lipid panel:  Allergies  Allergen Reactions   Levaquin [Levofloxacin] Other (See Comments)    EXACERBATES PT'S INTERSTITIAL CYSTITIS    Current Outpatient Medications  Medication Sig Dispense Refill   Coenzyme Q10 (COQ-10 PO) Take 1 tablet by mouth daily.     fluticasone  (FLONASE ) 50 MCG/ACT nasal spray Place 2 sprays into both nostrils daily. 16 g 6   hydrocortisone (ANUSOL-HC) 25 MG suppository Place 25 mg rectally 2 (two) times daily as needed.     omega-3 acid ethyl esters (LOVAZA) 1 g capsule Take 2 g by mouth 2 (two) times daily. Omega XL     omeprazole  (PRILOSEC) 40 MG capsule Take 1 capsule (40 mg total) by mouth daily. 90 capsule 1   Probiotic Product (PROBIOTIC DAILY PO) Take 2 tablets by mouth daily.      rosuvastatin  (CRESTOR ) 10 MG tablet Take 1 tablet (10 mg total) by mouth daily. 90 tablet 3   sucralfate (CARAFATE) 1 GM/10ML suspension Take 1 g by mouth 4 (four) times daily -  with meals and at bedtime. (Patient taking differently: Take 1 g by mouth 2 (two) times daily.)     tamsulosin (FLOMAX) 0.4 MG CAPS capsule Take 0.4 mg by mouth daily.     zinc gluconate 50 MG tablet Take 50 mg by mouth daily.     No current facility-administered medications for this visit.    OBJECTIVE: Vitals:   08/29/24 1029  BP: 138/78  Pulse: 77  Resp: 18  Temp: 97.9 F (36.6 C)  SpO2: 99%     Body mass index is 26.18 kg/m.    ECOG FS:0 - Asymptomatic  General: Well-developed, well-nourished, no acute distress. Eyes:  Pink conjunctiva, anicteric sclera. HEENT: Normocephalic, moist mucous membranes. Lungs: No audible wheezing or coughing. Heart: Regular rate and rhythm. Abdomen: Soft, nontender, no obvious distention. Musculoskeletal: No edema, cyanosis, or clubbing. Neuro: Alert, answering all questions appropriately. Cranial nerves grossly intact. Skin: No rashes or petechiae noted. Psych: Normal affect.  LAB RESULTS:  Lab Results  Component Value Date   NA 139 05/10/2024   K 4.1 05/10/2024   CL 101 05/10/2024   CO2 29 05/10/2024   GLUCOSE 79 05/10/2024   BUN 12 05/10/2024   CREATININE 0.97 05/10/2024   CALCIUM  9.7 05/10/2024   PROT 6.2 05/10/2024   ALBUMIN 4.6 05/10/2024   AST 17 05/10/2024   ALT 17 05/10/2024   ALKPHOS 70 05/10/2024   BILITOT 1.0 05/10/2024   GFRNONAA >60 02/14/2016  GFRAA >60 02/14/2016    Lab Results  Component Value Date   WBC 14.2 (H) 08/29/2024   NEUTROABS 5.0 08/29/2024   HGB 13.9 08/29/2024   HCT 40.6 08/29/2024   MCV 93.1 08/29/2024   PLT 180 08/29/2024     STUDIES: No results found.  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  Today's result is 14.2.  Today's result is approximately his baseline at 16.3.  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  Will get CLL FISH panel with next blood draw for further evaluation.  No intervention is needed.  Return to clinic in 6 months with repeat laboratory work and further evaluation.  If patient's white count remains stable at that point, he can possibly be transition to yearly evaluation.    I spent a total of 20 minutes reviewing chart data, face-to-face evaluation with the patient, counseling  and coordination of care as detailed above.   Patient expressed understanding and was in agreement with this plan. He also understands that He can call clinic at any time with any questions, concerns, or complaints.    Cancer Staging  CLL (chronic lymphocytic leukemia) (HCC) Staging form: Chronic Lymphocytic Leukemia / Small Lymphocytic Lymphoma, AJCC 8th Edition - Clinical stage from 08/30/2024: Modified Rai Stage 0 (Modified Rai risk: Low, Lymphocytosis: Present, Adenopathy: Absent, Organomegaly: Absent, Anemia: Absent, Thrombocytopenia: Absent) - Signed by Jacobo Evalene PARAS, MD on 08/30/2024 Stage prefix: Initial diagnosis   Evalene PARAS Jacobo, MD   08/30/2024 7:08 AM     "

## 2024-08-30 DIAGNOSIS — C9110 Chronic lymphocytic leukemia of B-cell type not having achieved remission: Secondary | ICD-10-CM | POA: Insufficient documentation

## 2025-02-22 ENCOUNTER — Ambulatory Visit: Admitting: Internal Medicine

## 2025-03-01 ENCOUNTER — Inpatient Hospital Stay

## 2025-03-01 ENCOUNTER — Inpatient Hospital Stay: Admitting: Oncology

## 2025-06-05 ENCOUNTER — Ambulatory Visit
